# Patient Record
Sex: Female | Born: 1955 | Race: White | Hispanic: No | Marital: Married | Smoking: Former smoker
Health system: Southern US, Community
[De-identification: ages and names within clinical notes are randomized; demographics above are authoritative.]

## PROBLEM LIST (undated history)

## (undated) DIAGNOSIS — D126 Benign neoplasm of colon, unspecified: Secondary | ICD-10-CM

## (undated) DIAGNOSIS — K5909 Other constipation: Secondary | ICD-10-CM

## (undated) DIAGNOSIS — E785 Hyperlipidemia, unspecified: Secondary | ICD-10-CM

## (undated) DIAGNOSIS — Z8669 Personal history of other diseases of the nervous system and sense organs: Secondary | ICD-10-CM

## (undated) DIAGNOSIS — I7100 Dissection of unspecified site of aorta: Secondary | ICD-10-CM

## (undated) DIAGNOSIS — K589 Irritable bowel syndrome without diarrhea: Secondary | ICD-10-CM

## (undated) DIAGNOSIS — I10 Essential (primary) hypertension: Secondary | ICD-10-CM

## (undated) HISTORY — DX: Irritable bowel syndrome, unspecified: K58.9

## (undated) HISTORY — PX: TONSILLECTOMY AND ADENOIDECTOMY: SUR1326

## (undated) HISTORY — DX: Personal history of other diseases of the nervous system and sense organs: Z86.69

## (undated) HISTORY — PX: FOOT SURGERY: SHX648

## (undated) HISTORY — DX: Essential (primary) hypertension: I10

## (undated) HISTORY — PX: LUMBAR FUSION: SHX111

## (undated) HISTORY — DX: Hyperlipidemia, unspecified: E78.5

## (undated) HISTORY — DX: Dissection of unspecified site of aorta: I71.00

## (undated) HISTORY — PX: ABDOMINAL HYSTERECTOMY: SHX81

## (undated) HISTORY — DX: Benign neoplasm of colon, unspecified: D12.6

## (undated) HISTORY — DX: Other constipation: K59.09

---

## 1998-12-07 ENCOUNTER — Other Ambulatory Visit: Admission: RE | Admit: 1998-12-07 | Discharge: 1998-12-07 | Payer: Self-pay | Admitting: Obstetrics and Gynecology

## 1999-03-24 ENCOUNTER — Ambulatory Visit (HOSPITAL_COMMUNITY): Admission: RE | Admit: 1999-03-24 | Discharge: 1999-03-24 | Payer: Self-pay | Admitting: Specialist

## 1999-07-19 ENCOUNTER — Emergency Department (HOSPITAL_COMMUNITY): Admission: EM | Admit: 1999-07-19 | Discharge: 1999-07-19 | Payer: Self-pay | Admitting: Emergency Medicine

## 1999-11-02 ENCOUNTER — Other Ambulatory Visit: Admission: RE | Admit: 1999-11-02 | Discharge: 1999-11-02 | Payer: Self-pay | Admitting: Otolaryngology

## 1999-11-02 ENCOUNTER — Encounter (INDEPENDENT_AMBULATORY_CARE_PROVIDER_SITE_OTHER): Payer: Self-pay | Admitting: Specialist

## 1999-11-24 ENCOUNTER — Other Ambulatory Visit: Admission: RE | Admit: 1999-11-24 | Discharge: 1999-11-24 | Payer: Self-pay | Admitting: Obstetrics and Gynecology

## 2000-12-13 ENCOUNTER — Encounter (INDEPENDENT_AMBULATORY_CARE_PROVIDER_SITE_OTHER): Payer: Self-pay

## 2000-12-13 ENCOUNTER — Other Ambulatory Visit: Admission: RE | Admit: 2000-12-13 | Discharge: 2000-12-13 | Payer: Self-pay | Admitting: Obstetrics and Gynecology

## 2001-02-07 ENCOUNTER — Other Ambulatory Visit: Admission: RE | Admit: 2001-02-07 | Discharge: 2001-02-07 | Payer: Self-pay | Admitting: Obstetrics and Gynecology

## 2001-12-08 ENCOUNTER — Observation Stay (HOSPITAL_COMMUNITY): Admission: RE | Admit: 2001-12-08 | Discharge: 2001-12-09 | Payer: Self-pay | Admitting: Obstetrics and Gynecology

## 2001-12-08 ENCOUNTER — Encounter (INDEPENDENT_AMBULATORY_CARE_PROVIDER_SITE_OTHER): Payer: Self-pay | Admitting: Specialist

## 2002-06-04 ENCOUNTER — Ambulatory Visit (HOSPITAL_COMMUNITY): Admission: RE | Admit: 2002-06-04 | Discharge: 2002-06-04 | Payer: Self-pay | Admitting: Gastroenterology

## 2002-11-09 ENCOUNTER — Other Ambulatory Visit: Admission: RE | Admit: 2002-11-09 | Discharge: 2002-11-09 | Payer: Self-pay | Admitting: Obstetrics and Gynecology

## 2004-02-03 ENCOUNTER — Other Ambulatory Visit: Admission: RE | Admit: 2004-02-03 | Discharge: 2004-02-03 | Payer: Self-pay | Admitting: Obstetrics and Gynecology

## 2005-04-02 ENCOUNTER — Other Ambulatory Visit: Admission: RE | Admit: 2005-04-02 | Discharge: 2005-04-02 | Payer: Self-pay | Admitting: *Deleted

## 2006-03-13 ENCOUNTER — Ambulatory Visit: Payer: Self-pay | Admitting: Internal Medicine

## 2006-03-21 ENCOUNTER — Ambulatory Visit: Payer: Self-pay

## 2006-03-21 ENCOUNTER — Ambulatory Visit: Payer: Self-pay | Admitting: Internal Medicine

## 2006-04-05 ENCOUNTER — Ambulatory Visit: Payer: Self-pay | Admitting: Internal Medicine

## 2007-02-20 ENCOUNTER — Ambulatory Visit: Payer: Self-pay | Admitting: Family Medicine

## 2007-02-20 LAB — CONVERTED CEMR LAB
Alkaline Phosphatase: 60 units/L (ref 39–117)
Basophils Absolute: 0 10*3/uL (ref 0.0–0.1)
Basophils Relative: 0.6 % (ref 0.0–1.0)
Bilirubin, Direct: 0.1 mg/dL (ref 0.0–0.3)
CO2: 28 meq/L (ref 19–32)
Chloride: 107 meq/L (ref 96–112)
Creatinine, Ser: 0.5 mg/dL (ref 0.4–1.2)
Hemoglobin: 13.8 g/dL (ref 12.0–15.0)
Lipase: 29 units/L (ref 11.0–59.0)
MCHC: 34.7 g/dL (ref 30.0–36.0)
MCV: 90.3 fL (ref 78.0–100.0)
Monocytes Absolute: 0.2 10*3/uL (ref 0.2–0.7)
Monocytes Relative: 5.4 % (ref 3.0–11.0)
Platelets: 196 10*3/uL (ref 150–400)
RDW: 11.9 % (ref 11.5–14.6)
Sodium: 140 meq/L (ref 135–145)
Total Bilirubin: 1.2 mg/dL (ref 0.3–1.2)
Total Protein: 6.9 g/dL (ref 6.0–8.3)
WBC: 4.4 10*3/uL — ABNORMAL LOW (ref 4.5–10.5)

## 2007-03-10 ENCOUNTER — Ambulatory Visit: Payer: Self-pay | Admitting: Internal Medicine

## 2007-03-10 ENCOUNTER — Encounter: Payer: Self-pay | Admitting: Internal Medicine

## 2007-03-12 ENCOUNTER — Encounter: Admission: RE | Admit: 2007-03-12 | Discharge: 2007-03-12 | Payer: Self-pay | Admitting: Family Medicine

## 2007-03-26 ENCOUNTER — Encounter (INDEPENDENT_AMBULATORY_CARE_PROVIDER_SITE_OTHER): Payer: Self-pay | Admitting: Internal Medicine

## 2007-04-09 DIAGNOSIS — F329 Major depressive disorder, single episode, unspecified: Secondary | ICD-10-CM

## 2007-06-27 ENCOUNTER — Ambulatory Visit: Payer: Self-pay | Admitting: Gastroenterology

## 2007-07-23 ENCOUNTER — Ambulatory Visit: Payer: Self-pay | Admitting: Gastroenterology

## 2007-07-23 ENCOUNTER — Encounter: Payer: Self-pay | Admitting: Gastroenterology

## 2007-07-23 ENCOUNTER — Encounter: Payer: Self-pay | Admitting: Family Medicine

## 2007-07-28 ENCOUNTER — Encounter: Payer: Self-pay | Admitting: Internal Medicine

## 2007-11-05 ENCOUNTER — Telehealth (INDEPENDENT_AMBULATORY_CARE_PROVIDER_SITE_OTHER): Payer: Self-pay | Admitting: *Deleted

## 2007-11-06 ENCOUNTER — Ambulatory Visit: Payer: Self-pay | Admitting: Internal Medicine

## 2007-11-06 DIAGNOSIS — S300XXA Contusion of lower back and pelvis, initial encounter: Secondary | ICD-10-CM

## 2007-11-15 LAB — CONVERTED CEMR LAB
Basophils Relative: 0.4 % (ref 0.0–1.0)
Eosinophils Absolute: 0.3 10*3/uL (ref 0.0–0.6)
Eosinophils Relative: 4.8 % (ref 0.0–5.0)
Hemoglobin: 13.7 g/dL (ref 12.0–15.0)
MCHC: 34.6 g/dL (ref 30.0–36.0)
Monocytes Absolute: 0.2 10*3/uL (ref 0.2–0.7)
Monocytes Relative: 3.1 % (ref 3.0–11.0)
Neutro Abs: 4.2 10*3/uL (ref 1.4–7.7)
RBC: 4.34 M/uL (ref 3.87–5.11)

## 2007-11-17 ENCOUNTER — Encounter (INDEPENDENT_AMBULATORY_CARE_PROVIDER_SITE_OTHER): Payer: Self-pay | Admitting: *Deleted

## 2007-12-05 IMAGING — US US PELVIS COMPLETE
1 series · 13 of 25 positions shown · non-contrast
Comparison: none

CLINICAL DATA: COMPLETE ABDOMINAL ULTRASOUND:
TECHNIQUE: Complete abdominal ultrasound examination was performed including evaluation of the liver, gallbladder, bile ducts, pancreas, kidneys, spleen, IVC, and abdominal aorta.
TECHNIQUE: Both transabdominal and transvaginal ultrasound examinations of the pelvis were performed including evaluation of the uterus, ovaries, adnexal regions, and pelvic cul-de-sac.

[Series 1: us pelvis complete · 0.27mm/px · 13 of 32 slices shown]
[im 1/32]
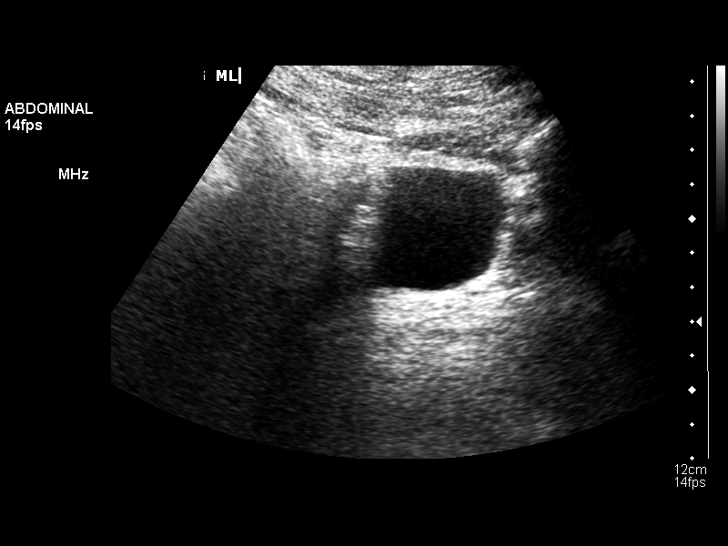
[im 3/32]
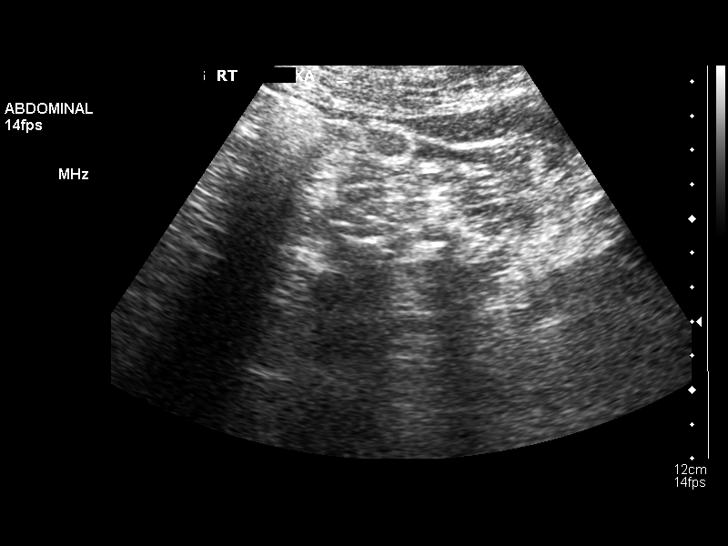
[im 6/32]
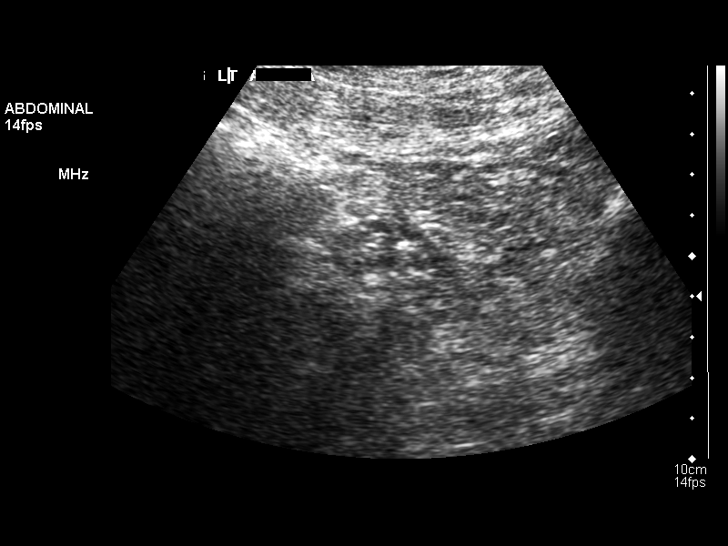
[im 8/32]
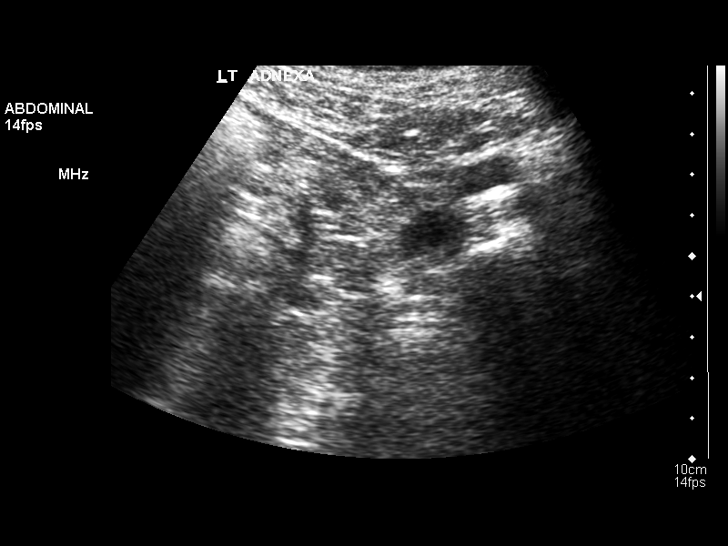
[im 11/32]
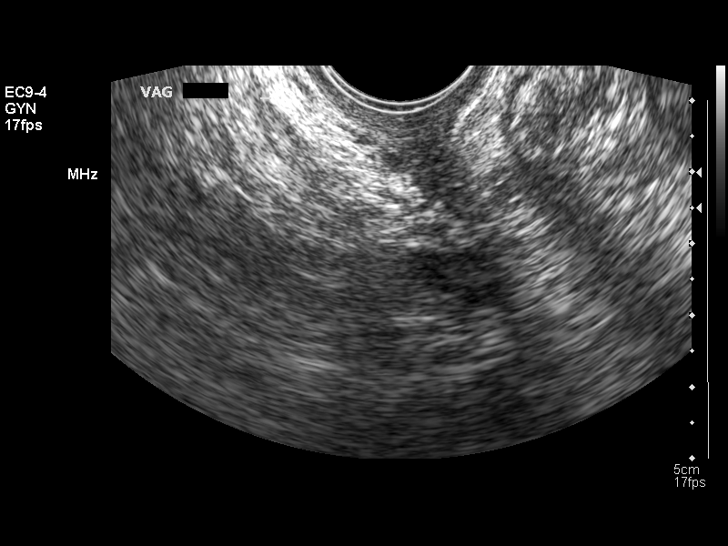
[im 13/32]
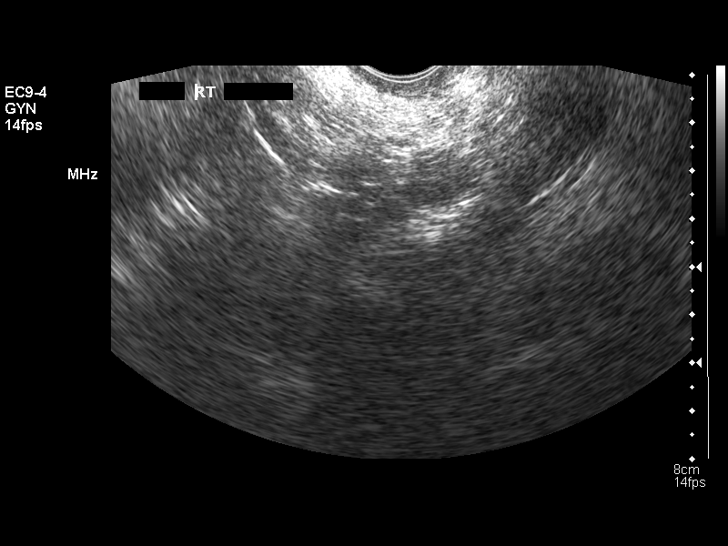
[im 16/32]
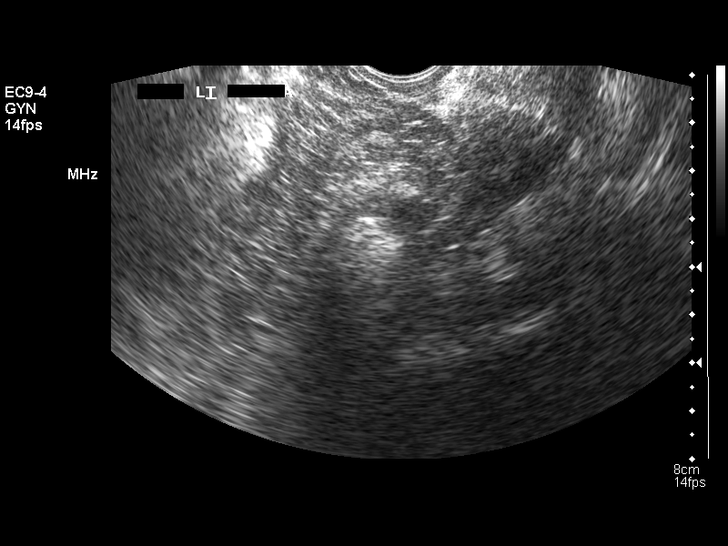
[im 19/32]
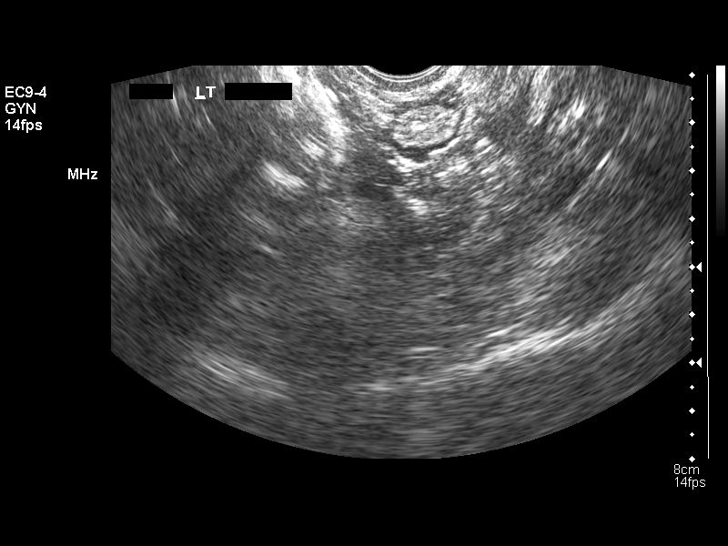
[im 21/32]
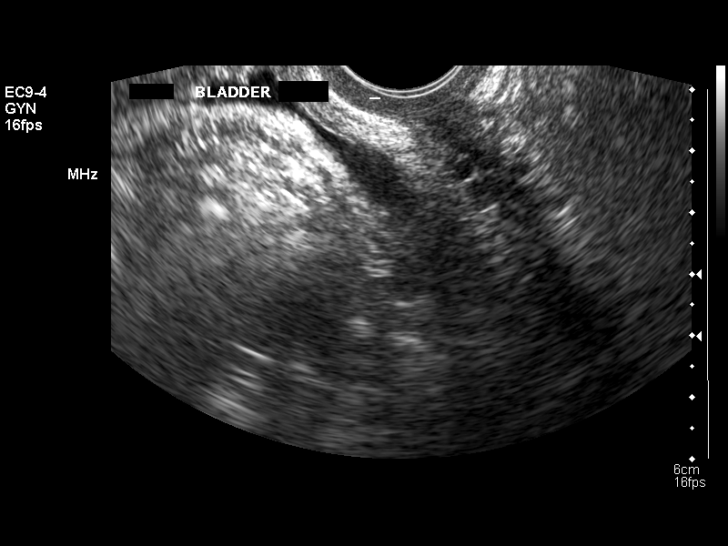
[im 24/32]
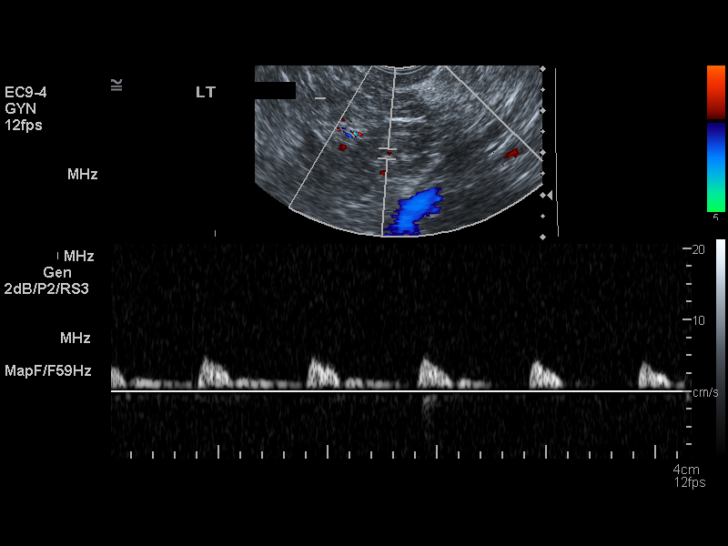
[im 26/32]
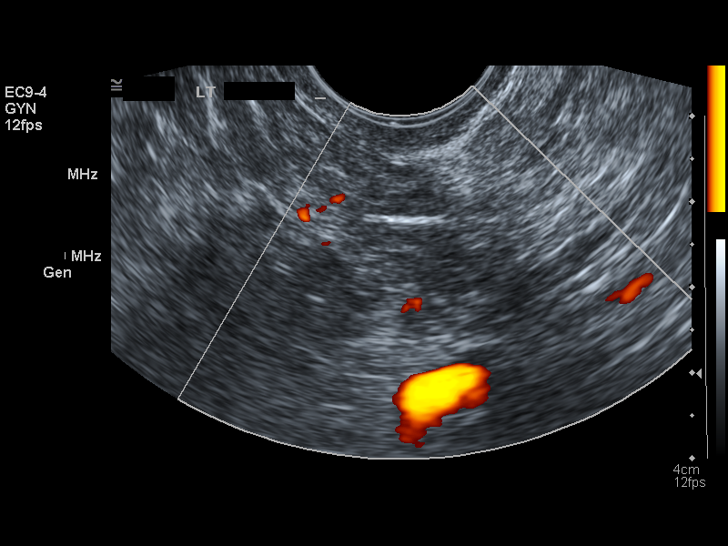
[im 29/32]
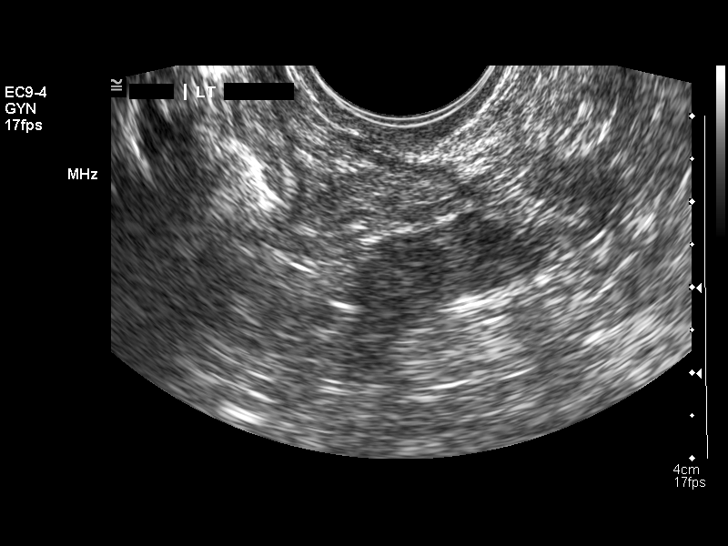
[im 32/32]
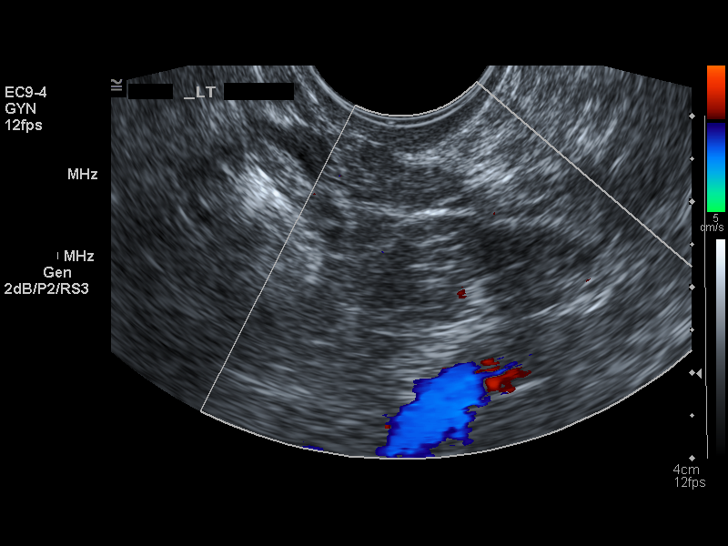

[13 of 25 positions shown; findings below may reference images not displayed]

FINDINGS: Scans over the upper abdomen were performed.  There is a single gallbladder polyp of 6mm.  No gallstones are seen.  The liver has a normal echogenic pattern.  The common bile duct is within upper limits of normal measuring 6.6mm.  The IVC, pancreas, and spleen are well seen with the spleen being within upper limits of normal.  No hydronephrosis is seen other than minimal fullness to the upper pole collecting system on the right which appears to be caused by compression of the collecting system by a large cyst in the upper pole of 8.3 x 6.4 x 7.8cm.  The right kidney measures 11.9cm sagittally with the left kidney measuring 13.0cm.  The abdominal aorta is fusiformly prominent with no focal aneurysmal dilatation.
IMPRESSION: 1.  Single gallbladder polyp.  No gallstones.  Common bile duct upper normal.  
2.  Liver and spleen are within upper limits of normal. 
3.  Large cyst in upper pole of right kidney. 
COMPLETE PELVIC/TRANSVAGINAL ULTRASOUND:
FINDINGS: By history the patient has undergone hysterectomy and single oophorectomy not knowing which ovary was removed.  The uterus and right ovary are not seen.  There is questionable left ovary of 2.0 x 0.9 x 1.3cm.  No free fluid is seen.
IMPRESSION: No pelvic mass or adenopathy.  Question small left ovary remaining with prior history of hysterectomy and probable right oophorectomy.

## 2008-01-26 DIAGNOSIS — K449 Diaphragmatic hernia without obstruction or gangrene: Secondary | ICD-10-CM | POA: Insufficient documentation

## 2008-01-26 DIAGNOSIS — K649 Unspecified hemorrhoids: Secondary | ICD-10-CM

## 2008-01-26 DIAGNOSIS — D126 Benign neoplasm of colon, unspecified: Secondary | ICD-10-CM

## 2008-01-26 DIAGNOSIS — Z8719 Personal history of other diseases of the digestive system: Secondary | ICD-10-CM

## 2008-07-04 ENCOUNTER — Inpatient Hospital Stay (HOSPITAL_COMMUNITY): Admission: AD | Admit: 2008-07-04 | Discharge: 2008-07-13 | Payer: Self-pay | Admitting: Internal Medicine

## 2008-07-04 ENCOUNTER — Ambulatory Visit: Payer: Self-pay | Admitting: Thoracic Surgery (Cardiothoracic Vascular Surgery)

## 2008-07-04 ENCOUNTER — Encounter: Payer: Self-pay | Admitting: Internal Medicine

## 2008-07-04 ENCOUNTER — Encounter: Payer: Self-pay | Admitting: Emergency Medicine

## 2008-07-04 ENCOUNTER — Ambulatory Visit: Payer: Self-pay | Admitting: Internal Medicine

## 2008-07-04 ENCOUNTER — Ambulatory Visit: Payer: Self-pay | Admitting: Cardiology

## 2008-07-04 DIAGNOSIS — R9431 Abnormal electrocardiogram [ECG] [EKG]: Secondary | ICD-10-CM | POA: Insufficient documentation

## 2008-07-04 DIAGNOSIS — K219 Gastro-esophageal reflux disease without esophagitis: Secondary | ICD-10-CM | POA: Insufficient documentation

## 2008-07-08 ENCOUNTER — Encounter: Payer: Self-pay | Admitting: Internal Medicine

## 2008-07-22 ENCOUNTER — Ambulatory Visit: Payer: Self-pay | Admitting: Internal Medicine

## 2008-07-22 DIAGNOSIS — F411 Generalized anxiety disorder: Secondary | ICD-10-CM | POA: Insufficient documentation

## 2008-07-22 DIAGNOSIS — I7101 Dissection of thoracic aorta: Secondary | ICD-10-CM

## 2008-08-10 ENCOUNTER — Encounter: Payer: Self-pay | Admitting: Internal Medicine

## 2008-08-12 ENCOUNTER — Ambulatory Visit: Payer: Self-pay | Admitting: Internal Medicine

## 2008-08-12 DIAGNOSIS — G4700 Insomnia, unspecified: Secondary | ICD-10-CM

## 2008-08-19 ENCOUNTER — Telehealth (INDEPENDENT_AMBULATORY_CARE_PROVIDER_SITE_OTHER): Payer: Self-pay | Admitting: *Deleted

## 2008-08-27 ENCOUNTER — Encounter: Payer: Self-pay | Admitting: Internal Medicine

## 2008-08-31 ENCOUNTER — Telehealth: Payer: Self-pay | Admitting: Internal Medicine

## 2008-08-31 ENCOUNTER — Encounter: Payer: Self-pay | Admitting: Internal Medicine

## 2008-09-07 ENCOUNTER — Telehealth (INDEPENDENT_AMBULATORY_CARE_PROVIDER_SITE_OTHER): Payer: Self-pay | Admitting: *Deleted

## 2008-09-14 ENCOUNTER — Telehealth (INDEPENDENT_AMBULATORY_CARE_PROVIDER_SITE_OTHER): Payer: Self-pay | Admitting: *Deleted

## 2008-09-14 ENCOUNTER — Ambulatory Visit: Payer: Self-pay | Admitting: Internal Medicine

## 2008-09-14 DIAGNOSIS — I951 Orthostatic hypotension: Secondary | ICD-10-CM | POA: Insufficient documentation

## 2008-09-14 DIAGNOSIS — R7309 Other abnormal glucose: Secondary | ICD-10-CM

## 2008-09-20 ENCOUNTER — Encounter (INDEPENDENT_AMBULATORY_CARE_PROVIDER_SITE_OTHER): Payer: Self-pay | Admitting: *Deleted

## 2008-09-20 LAB — CONVERTED CEMR LAB: Hgb A1c MFr Bld: 5.5 % (ref 4.6–6.0)

## 2008-10-11 ENCOUNTER — Telehealth (INDEPENDENT_AMBULATORY_CARE_PROVIDER_SITE_OTHER): Payer: Self-pay | Admitting: *Deleted

## 2008-10-12 ENCOUNTER — Ambulatory Visit: Payer: Self-pay | Admitting: Family Medicine

## 2008-10-12 ENCOUNTER — Encounter (INDEPENDENT_AMBULATORY_CARE_PROVIDER_SITE_OTHER): Payer: Self-pay | Admitting: *Deleted

## 2008-11-02 ENCOUNTER — Telehealth (INDEPENDENT_AMBULATORY_CARE_PROVIDER_SITE_OTHER): Payer: Self-pay | Admitting: *Deleted

## 2008-11-08 ENCOUNTER — Telehealth (INDEPENDENT_AMBULATORY_CARE_PROVIDER_SITE_OTHER): Payer: Self-pay | Admitting: *Deleted

## 2008-11-09 ENCOUNTER — Encounter: Payer: Self-pay | Admitting: Internal Medicine

## 2009-02-07 ENCOUNTER — Telehealth (INDEPENDENT_AMBULATORY_CARE_PROVIDER_SITE_OTHER): Payer: Self-pay | Admitting: *Deleted

## 2009-03-17 ENCOUNTER — Ambulatory Visit: Payer: Self-pay | Admitting: Family Medicine

## 2009-05-23 ENCOUNTER — Ambulatory Visit: Payer: Self-pay | Admitting: Internal Medicine

## 2009-05-23 DIAGNOSIS — M2550 Pain in unspecified joint: Secondary | ICD-10-CM | POA: Insufficient documentation

## 2009-05-23 DIAGNOSIS — IMO0001 Reserved for inherently not codable concepts without codable children: Secondary | ICD-10-CM

## 2009-05-25 LAB — CONVERTED CEMR LAB
Calcium: 9.2 mg/dL (ref 8.4–10.5)
Iron: 71 ug/dL (ref 42–145)
Potassium: 3.1 meq/L — ABNORMAL LOW (ref 3.5–5.1)
Rhuematoid fact SerPl-aCnc: 20 intl units/mL (ref 0.0–20.0)

## 2009-05-26 ENCOUNTER — Encounter (INDEPENDENT_AMBULATORY_CARE_PROVIDER_SITE_OTHER): Payer: Self-pay | Admitting: *Deleted

## 2009-05-26 ENCOUNTER — Telehealth (INDEPENDENT_AMBULATORY_CARE_PROVIDER_SITE_OTHER): Payer: Self-pay | Admitting: *Deleted

## 2009-06-07 ENCOUNTER — Encounter: Payer: Self-pay | Admitting: Internal Medicine

## 2009-06-21 ENCOUNTER — Telehealth (INDEPENDENT_AMBULATORY_CARE_PROVIDER_SITE_OTHER): Payer: Self-pay | Admitting: *Deleted

## 2009-07-22 ENCOUNTER — Telehealth (INDEPENDENT_AMBULATORY_CARE_PROVIDER_SITE_OTHER): Payer: Self-pay | Admitting: *Deleted

## 2009-08-04 ENCOUNTER — Ambulatory Visit: Payer: Self-pay | Admitting: Internal Medicine

## 2009-08-05 ENCOUNTER — Telehealth (INDEPENDENT_AMBULATORY_CARE_PROVIDER_SITE_OTHER): Payer: Self-pay | Admitting: *Deleted

## 2009-08-05 DIAGNOSIS — S97109A Crushing injury of unspecified toe(s), initial encounter: Secondary | ICD-10-CM | POA: Insufficient documentation

## 2009-10-18 ENCOUNTER — Telehealth (INDEPENDENT_AMBULATORY_CARE_PROVIDER_SITE_OTHER): Payer: Self-pay | Admitting: *Deleted

## 2009-12-13 ENCOUNTER — Telehealth (INDEPENDENT_AMBULATORY_CARE_PROVIDER_SITE_OTHER): Payer: Self-pay | Admitting: *Deleted

## 2009-12-14 ENCOUNTER — Encounter (INDEPENDENT_AMBULATORY_CARE_PROVIDER_SITE_OTHER): Payer: Self-pay | Admitting: *Deleted

## 2009-12-14 ENCOUNTER — Ambulatory Visit: Payer: Self-pay | Admitting: Internal Medicine

## 2009-12-14 DIAGNOSIS — K5900 Constipation, unspecified: Secondary | ICD-10-CM

## 2009-12-14 DIAGNOSIS — R1033 Periumbilical pain: Secondary | ICD-10-CM | POA: Insufficient documentation

## 2009-12-14 DIAGNOSIS — I959 Hypotension, unspecified: Secondary | ICD-10-CM

## 2009-12-21 ENCOUNTER — Telehealth (INDEPENDENT_AMBULATORY_CARE_PROVIDER_SITE_OTHER): Payer: Self-pay | Admitting: *Deleted

## 2009-12-21 ENCOUNTER — Encounter: Admission: RE | Admit: 2009-12-21 | Discharge: 2009-12-21 | Payer: Self-pay | Admitting: Internal Medicine

## 2009-12-21 ENCOUNTER — Ambulatory Visit: Payer: Self-pay | Admitting: Internal Medicine

## 2009-12-22 ENCOUNTER — Encounter: Payer: Self-pay | Admitting: Internal Medicine

## 2009-12-23 LAB — CONVERTED CEMR LAB
Amylase: 40 units/L (ref 27–131)
Basophils Relative: 0.6 % (ref 0.0–3.0)
Eosinophils Absolute: 0.1 10*3/uL (ref 0.0–0.7)
Eosinophils Relative: 2.2 % (ref 0.0–5.0)
Lymphocytes Relative: 28 % (ref 12.0–46.0)
MCV: 93.1 fL (ref 78.0–100.0)
Monocytes Absolute: 0.3 10*3/uL (ref 0.1–1.0)
Monocytes Relative: 6.2 % (ref 3.0–12.0)
Neutro Abs: 2.7 10*3/uL (ref 1.4–7.7)
RBC: 4.11 M/uL (ref 3.87–5.11)

## 2010-01-02 ENCOUNTER — Telehealth: Payer: Self-pay | Admitting: Internal Medicine

## 2010-02-20 ENCOUNTER — Telehealth (INDEPENDENT_AMBULATORY_CARE_PROVIDER_SITE_OTHER): Payer: Self-pay | Admitting: *Deleted

## 2010-03-14 ENCOUNTER — Telehealth (INDEPENDENT_AMBULATORY_CARE_PROVIDER_SITE_OTHER): Payer: Self-pay | Admitting: *Deleted

## 2010-03-24 ENCOUNTER — Ambulatory Visit: Payer: Self-pay | Admitting: Internal Medicine

## 2010-03-24 DIAGNOSIS — R519 Headache, unspecified: Secondary | ICD-10-CM | POA: Insufficient documentation

## 2010-03-24 DIAGNOSIS — R51 Headache: Secondary | ICD-10-CM

## 2010-03-24 DIAGNOSIS — G459 Transient cerebral ischemic attack, unspecified: Secondary | ICD-10-CM | POA: Insufficient documentation

## 2010-03-24 DIAGNOSIS — I10 Essential (primary) hypertension: Secondary | ICD-10-CM | POA: Insufficient documentation

## 2010-03-28 ENCOUNTER — Telehealth (INDEPENDENT_AMBULATORY_CARE_PROVIDER_SITE_OTHER): Payer: Self-pay | Admitting: *Deleted

## 2010-03-29 ENCOUNTER — Telehealth: Payer: Self-pay | Admitting: Internal Medicine

## 2010-04-18 ENCOUNTER — Encounter: Payer: Self-pay | Admitting: Internal Medicine

## 2010-04-19 ENCOUNTER — Encounter: Payer: Self-pay | Admitting: Internal Medicine

## 2010-05-02 ENCOUNTER — Telehealth (INDEPENDENT_AMBULATORY_CARE_PROVIDER_SITE_OTHER): Payer: Self-pay | Admitting: *Deleted

## 2010-05-26 ENCOUNTER — Ambulatory Visit: Payer: Self-pay | Admitting: Internal Medicine

## 2010-09-01 ENCOUNTER — Encounter: Payer: Self-pay | Admitting: Internal Medicine

## 2010-11-19 ENCOUNTER — Encounter: Payer: Self-pay | Admitting: Thoracic Surgery (Cardiothoracic Vascular Surgery)

## 2010-11-30 NOTE — Progress Notes (Signed)
Summary: U/S Results  Phone Note Outgoing Call Call back at [facility] Phone [phone]   Call placed by: Shonna Chock,  December 21, 2009 3:32 PM Call placed to: Patient Summary of Call: Left message on machine for patient to return call when avaliable, Reason for call:   [place] of pelvis reveal no significant pathology. Please FAX these reports to her Gyn also. Hopp Initial call taken by: Shonna Chock,  December 21, 2009 3:32 PM  Follow-up for Phone Call        left message to call office...........Marland KitchenFelecia Deloach CMA  December 23, 2009 9:54 AM   Additional Follow-up for Phone Call Additional follow up Details #1::        pt GYN is Dr Harold Hedge reports faxed..........Marland KitchenFelecia Deloach CMA  December 23, 2009 3:29 PM

## 2010-11-30 NOTE — Assessment & Plan Note (Signed)
Summary: severe stomach pains/kdc   Vital Signs:  Patient profile:   55 year old female Weight:      158.2 pounds Temp:     98.1 degrees F oral Pulse rate:   64 / minute Resp:     14 per minute BP sitting:   100 / 60  (left arm) Cuff size:   large  Vitals Entered By: Shonna Chock (December 14, 2009 3:56 PM) CC: Stomach discomfort, Abdominal pain Comments REVIEWED MED LIST, PATIENT AGREED DOSE AND INSTRUCTION CORRECT    Primary Care Provider:  Alwyn Ren  CC:  Stomach discomfort and Abdominal pain.  History of Present Illness:  Abdominal Pain      This is a 55 year old woman who presents with Abdominal pain for 2 weeks .  The patient reports constipation and  slight anorexia, but denies nausea, vomiting, diarrhea, melena, hematochezia, and hematemesis.  The location of the pain is right mid  quadrant.  The pain is described as intermittent, sharp, and radiating to the back.No triggers or relievers for abd pain.  The patient denies the following symptoms: fever, weight loss, dysuria, chest pain, jaundice, dark urine, and vaginal bleeding. Rx: Miralx , PeriColace , Fleet's suppository w/o benefit. PMH of dissecting  thoracoabdominal aorta, monitored @ [facility], Dr Everardo Beals .No PMH of appendectomy; PMH of TAH & USO by Dr Henderson Cloud ( ? R oophorectomy ) for fibroids , dysfunctional menses & ? ovarian disease. BP recently low , down to 70/40  Allergies (verified): No Known Drug Allergies  Review of Systems General:  Denies chills and sweats. ENT:  Denies difficulty swallowing and hoarseness. GI:  Denies gas. GU:  Denies discharge and hematuria. Derm:  Denies lesion(s) and rash. Heme:  Denies abnormal bruising and bleeding.  Physical Exam  General:  well-nourished,in no acute distress; alert,appropriate and cooperative throughout examination; mildly uncomfortable-appearing.   Eyes:  No corneal or conjunctival inflammation noted.Perrla. Funduscopic exam benign, without hemorrhages, exudates or  papilledema. No icterus Mouth:  Oral mucosa and oropharynx without lesions or exudates.  Teeth in good repair. No pharyngeal erythema.   Lungs:  Normal respiratory effort, chest expands symmetrically. Lungs are clear to auscultation, no crackles or wheezes. Heart:  regular rhythm, no murmur, and bradycardia.   Abdomen:  Bowel sounds positive,abdomen soft  but tender  R mid without masses, organomegaly or hernias noted. Rectal:  Given stool cards Skin:  Intact without suspicious lesions or rashes. No jaundice Cervical Nodes:  No lymphadenopathy noted Axillary Nodes:  No palpable lymphadenopathy Psych:  Oriented X3 ; subdued.     Impression & Recommendations:  Problem # 1:  ABDOMINAL PAIN, PERIUMBILICAL (ICD-789.05)  Orders: Radiology Referral (Radiology)  Problem # 2:  CONSTIPATION (ICD-564.00)  Problem # 3:  HYPOTENSION (ICD-458.9) intermittent  Complete Medication List: 1)  Effexor Xr 75 Mg Xr24h-cap (Venlafaxine hcl) .Marland Kitchen.. 1 by mouth once daily 2)  Catapres 0.1 Mg Tabs (Clonidine hcl) .... 1/2  by mouth  two times a day 3)  Labetalol Hcl 200 Mg Tabs (Labetalol hcl) .... 2 by mouth three times a day 4)  Vitamin D (ergocalciferol) 50000 Unit Caps (Ergocalciferol) .Marland Kitchen.. 1 by mouth 1 x weekly**take the same day every week** 5)  Spironolactone 25 Mg Tabs (Spironolactone) .Marland Kitchen.. 1 by mouth once daily 6)  Hyoscyamine Sulfate 0.125 Mg Subl (Hyoscyamine sulfate) .Marland Kitchen.. 1 sl q 6 hrs as needed  Patient Instructions: 1)  Fasting labs: amylase, lipase, CBC & dif, TSH, vit D level(Codes: 789.05, 268.9,564.00). Complete stool cards.  Prescriptions: HYOSCYAMINE SULFATE 0.125 MG SUBL (HYOSCYAMINE SULFATE) 1 sl q 6 hrs as needed  #30 x 0   Entered and Authorized by:   Marga Melnick MD   Signed by:   Marga Melnick MD on 12/14/2009   Method used:   [facility] ...       [facility]  [address] [phone]* (retail)       [address] [place], [place]  [address]       Ph: [phone] or [phone]        Fax: [phone]   RxID:   [id number]

## 2010-11-30 NOTE — Letter (Signed)
Summary: [facility]  [facility]   Imported By: Lanelle Bal 05/10/2010 10:10:34  _____________________________________________________________________  External Attachment:    Type:   Image     Comment:   External Document

## 2010-11-30 NOTE — Progress Notes (Signed)
Summary: Refill Request  Phone Note Refill Request Call back at [phone] Message from:  Pharmacy on Mar 28, 2010 9:33 AM  Refills Requested: Medication #1:  LABETALOL HCL 200 MG TABS 2 by mouth three times a day   Dosage confirmed as above?Dosage Confirmed   [facility] [address]  Next Appointment Scheduled: 6.1.11 (lab) Initial call taken by: Harold Barban,  Mar 28, 2010 9:33 AM  Follow-up for Phone Call        Called the pharmacy and indicated :02/20/10 # 180 with 2 refills  Patient should not be due for additional refills at this time Follow-up by: Shonna Chock,  Mar 28, 2010 10:20 AM

## 2010-11-30 NOTE — Progress Notes (Signed)
Summary: Refill Request  Phone Note Refill Request Call back at [phone] Message from:  Pharmacy on Mar 14, 2010 8:10 AM  Refills Requested: Medication #1:  HYDROCHLOROTHIAZIDE 12.5 MG CAPS take 1 cap once daily   Dosage confirmed as above?Dosage Confirmed   Supply Requested: 3 months   [facility] [address] [facility] [place]  Next Appointment Scheduled: none Initial call taken by: Harold Barban,  Mar 14, 2010 8:10 AM    Prescriptions: HYDROCHLOROTHIAZIDE 12.5 MG CAPS (HYDROCHLOROTHIAZIDE) take 1 cap once daily  #90 x 1   Entered by:   Shonna Chock   Authorized by:   Marga Melnick MD   Signed by:   Shonna Chock on 03/14/2010   Method used:   [facility]        [facility] [phone][facility] (retail)       [address] [place], [place]  [address]       Ph: [phone] or [phone]       Fax: [phone]   RxID:   [id number]

## 2010-11-30 NOTE — Progress Notes (Signed)
Summary: SEVERE STOMACH PAINS  Phone Note Call from Patient Call back at Work Phone [phone] Call back at [phone]   Caller: Patient Summary of Call: PATIENT CALLED TO GET APPOINTMENT WITH DR HOPPER DUE TO SEVERE STOMACH PAINS---SAYS SHE HAD AORTIC DISSECTION LAST YEAR AND IS WORRIED IT MIGHT BE SAME THING---PAIN HAS BEEN GOING ON FOR A FEW DAYS--SHE HASNT TAKEN ANYTHING FOR IT--VERY WORRIED--WANTS TO BE SEEN TODAY  PLEASE CALL [phone] Initial call taken by: Jerolyn Shin,  December 13, 2009 12:35 PM  Follow-up for Phone Call        Spoke with patient and informed  her Dr.Hopper is running behind and we could double-book her but she will have to wait (1 hour or less) Patient said she doesnt feel like waiting. Patient said she would rather come in tomorrow, late if possible. 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 Follow-up by: Shonna Chock,  December 13, 2009 1:20 PM    Additional Follow-up for Phone Call Additional follow up Details #2::    pt scheduled for Feb 16,2011 Follow-up by: Barb Merino,  December 13, 2009 2:38 PM

## 2010-11-30 NOTE — Letter (Signed)
Summary: [facility]  [facility]   Imported By: Lanelle Bal 09/19/2010 08:22:13  _____________________________________________________________________  External Attachment:    Type:   Image     Comment:   External Document

## 2010-11-30 NOTE — Assessment & Plan Note (Signed)
Summary: HOSPTIAL FOLLOWUP/KN   Vital Signs:  Patient profile:   55 year old female Weight:      151.8 pounds BMI:     25.35 Pulse rate:   64 / minute Resp:     14 per minute BP sitting:   94 / 60  (left arm) Cuff size:   large  Vitals Entered By: Shonna Chock CMA (May 26, 2010 3:54 PM) CC: Hospital Follow-up   Primary Care Provider:  Alwyn Ren  CC:  Hospital Follow-up.  History of Present Illness: Dr Norris Cross D/C Summary 04/19/2010 was reviewed; she has been taking  Labetatlol as Rxed (200 mg three times a day ) &  Clonidine 0.1 mg is once daily . BP parameters were no HYPOtension & no BP > 140/90. These have been met.Her records  indicate range 90+/ 60- 130/70. She has had increased energy since med adjustment.   The patient reports headaches, but these are less severe &  less frequent on Topamax. She  denies lightheadedness, urinary frequency, rash, and fatigue.  The patient denies the following associated symptoms: chest pain, chest pressure, exercise intolerance, dyspnea, palpitations, syncope, leg edema, and pedal edema.  Compliance with medications (by patient report) has been near 100%.  The patient reports that dietary compliance has been good(heart healthy diet).  The patient reports exercising daily as walking  45 min & she also climbs stairs @ work.  Adjunctive measures currently used by the patient include salt restriction, but Dr Tasia Catchings has recommended slight liberalization of sodium intake @ home.    Current Medications (verified): 1)  Effexor Xr 150 Mg Xr24h-Cap (Venlafaxine Hcl) .Marland Kitchen.. 1 By Mouth Once Daily 2)  Catapres 0.1 Mg Tabs (Clonidine Hcl) .... 1/2 Two Times A Day 3)  Labetalol Hcl 200 Mg Tabs (Labetalol Hcl) .Marland Kitchen.. 1 By Mouth Two Times A Day 4)  Hydrochlorothiazide 12.5 Mg Caps (Hydrochlorothiazide) .... Take 1 Cap Once Daily 5)  Acetaminophen-Codeine #3 300-30 Mg Tabs (Acetaminophen-Codeine) .Marland Kitchen.. 1 Every 6 Hrs As Needed Pain 6)  Topamax 25 Mg Tabs (Topiramate) .Marland Kitchen..  1 By Mouth Two Times A Day 7)  Ambien 10 Mg Tabs (Zolpidem Tartrate) .Marland Kitchen.. 1 By Mouth As Needed 8)  Clonazepam 1 Mg Tabs (Clonazepam) .Marland Kitchen.. 1 By Mouth Two Times A Day As Needed  Allergies (verified): No Known Drug Allergies  Physical Exam  General:  Thin but well-nourished, alert,appropriate and cooperative throughout examination Eyes:  No corneal or conjunctival inflammation noted.  Perrla. Funduscopic exam benign, without hemorrhages, exudates or papilledema. Lungs:  Normal respiratory effort, chest expands symmetrically. Lungs are clear to auscultation, no crackles or wheezes. Heart:  regular rhythm, no murmur, no gallop, no rub, no JVD, no HJR, and bradycardia.  Soft S4 Abdomen:  Bowel sounds positive,abdomen soft and non-tender without masses, organomegaly or hernias noted. No AAA or bruits Pulses:  R and L carotid,radial,dorsalis pedis and posterior tibial pulses are full and equal bilaterally Extremities:  No clubbing, cyanosis, edema. Neurologic:  alert & oriented X3.   Skin:  Intact without suspicious lesions or rashes Psych:  memory intact for recent and remote, normally interactive, and good eye contact.     Impression & Recommendations:  Problem # 1:  HYPERTENSION (ICD-401.9) well controlled Her updated medication list for this problem includes:    Catapres 0.1 Mg Tabs (Clonidine hcl) .Marland Kitchen... 1/2 two times a day    Labetalol Hcl 200 Mg Tabs (Labetalol hcl) .Marland Kitchen... 1 by mouth three times a day    Hydrochlorothiazide 12.5 Mg  Caps (Hydrochlorothiazide) .Marland Kitchen... Take 1 cap once daily  Problem # 2:  HEADACHE (ICD-784.0) improved on Topamax 25 mg two times a day  Her updated medication list for this problem includes:    Labetalol Hcl 200 Mg Tabs (Labetalol hcl) .Marland Kitchen... 1 by mouth three times a day    Acetaminophen-codeine #3 300-30 Mg Tabs (Acetaminophen-codeine) .Marland Kitchen... 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 1 by mouth once daily 2)  Catapres 0.1 Mg Tabs (Clonidine hcl) .... 1/2 two times a day 3)  Labetalol Hcl 200 Mg Tabs (Labetalol hcl) .Marland Kitchen.. 1 by mouth three times a day 4)  Hydrochlorothiazide 12.5 Mg Caps (Hydrochlorothiazide) .... Take 1 cap once daily 5)  Acetaminophen-codeine #3 300-30 Mg Tabs (Acetaminophen-codeine) .Marland Kitchen.. 1 every 6 hrs as needed pain 6)  Topamax 25 Mg Tabs (Topiramate) .Marland Kitchen.. 1 by mouth two times a day 7)  Ambien 10 Mg Tabs (Zolpidem tartrate) .Marland Kitchen.. 1 by mouth as needed 8)  Clonazepam 1 Mg Tabs (Clonazepam) .Marland Kitchen.. 1 by mouth two times a day as needed  Patient Instructions: 1)  Check your Blood Pressure regularly. If it is above: 135/85 ON AVERAGE  you should make an appointment. Keep Headache Diary  as discussed. Prescriptions: TOPAMAX 25 MG TABS (TOPIRAMATE) 1 by mouth two times a day  #60 x 11   Entered and Authorized by:   Marga Melnick MD   Signed by:   Marga Melnick MD on 05/26/2010   Method used:   [facility] ...       [facility]  [address] [phone]* (retail)       [address] [place], [place]  [address]       Ph: [phone] or [phone]       Fax: [phone]   RxID:   [id number] HYDROCHLOROTHIAZIDE 12.5 MG CAPS (HYDROCHLOROTHIAZIDE) take 1 cap once daily  #90 x 1   Entered and Authorized by:   Marga Melnick MD   Signed by:   Marga Melnick MD on 05/26/2010   Method used:   [facility]ed to ...       [facility]  [address] [phone]* (retail)       [address] [place], [place]  [address]       Ph: [phone] or [phone]       Fax: [phone]   RxID:   [id number] LABETALOL HCL 200 MG TABS (LABETALOL HCL) 1 by mouth three times a day  #270 x 1   Entered and Authorized  by:   Marga Melnick MD   Signed by:   Marga Melnick MD on 05/26/2010   Method used:   [facility]ed to ...       [facility]  [address] [phone]* (retail)       [address] [place], [place]  [address]       Ph: [phone] or [phone]       Fax: [phone]   RxID:   [id number] CATAPRES 0.1 MG TABS (CLONIDINE HCL) 1/2 two times a day  #90 x 3   Entered and  Authorized by:   Marga Melnick MD   Signed by:   Marga Melnick MD on 05/26/2010   Method used:   [facility]ed to ...       [facility]  [address] [phone]* (retail)       [address] [place], [place]  [address]       Ph: [phone] or [phone]       Fax: [phone]   RxID:   [id number]

## 2010-11-30 NOTE — Assessment & Plan Note (Signed)
Summary: severe headaches//lch   Vital Signs:  Patient profile:   55 year old female Weight:      158 pounds Temp:     98.3 degrees F oral Pulse rate:   72 / minute Resp:     16 per minute BP sitting:   116 / 70  (left arm) Cuff size:   large  Vitals Entered By: Shonna Chock (Mar 24, 2010 2:10 PM) CC: 1.) Severe Headaches and elevated B/P (at work today 140/something-patient states)  2.) Upper back pain, Hypertension Management Comments REVIEWED MED LIST, PATIENT AGREED DOSE AND INSTRUCTION CORRECT  ** FYI-Pending CT: July 2011**   Primary Care Provider:  Alwyn Ren  CC:  1.) Severe Headaches and elevated B/P (at work today 140/something-patient states)  2.) Upper back pain and Hypertension Management.  History of Present Illness: BP @ work in 140s/90s; this is associated with upper back pain similar to that pain with the thoracic aneurysm. She increased Clonidine to 1 two times a day from 1/2 two times a day with BP  response.   The patient reports photophobia and phonophobia, but denies nausea, vomiting, sweats, tearing of eyes, nasal congestion, sinus pain, and sinus pressure.  The headache is described as intermittent and stabbing.  The location of the pain is unilateral in  the left temple & frontal area.  High-risk features (red flags) include pain worse with exertion and age >50 years.  The patient denies the following high-risk features: fever, neck pain/stiffness, altered mental status, rash, and new type of headache.  Prior treatment has included  ? Tramadol with benefit.  Hypertension History:      She denies chest pain, palpitations, dyspnea with exertion, orthopnea, PND, peripheral edema, visual symptoms, neurologic problems, syncope, and side effects from treatment.        Positive major cardiovascular risk factors include female age 57 years old or older and hypertension.  Negative major cardiovascular risk factors include non-tobacco-user status.        Positive history for  target organ damage include prior stroke (or TIA) and peripheral vascular disease.     Allergies (verified): No Known Drug Allergies  Review of Systems Eyes:  Denies blurring, double vision, and vision loss-both eyes. MS:  Complains of joint pain and joint swelling; denies joint redness; Pain in L hand intermittently. Neuro:  Complains of inability to speak; denies brief paralysis, numbness, tingling, and weakness; Slurred speech for 3-4 minutes 3 weeks ago.  Physical Exam  General:  in no acute distress; alert,appropriate and cooperative throughout examination Eyes:  No corneal or conjunctival inflammation noted. EOMI. Perrla. Field of Vision grossly normal. Ears:  External ear exam shows no significant lesions or deformities.  Otoscopic examination reveals clear canals, tympanic membranes are intact bilaterally without bulging, retraction, inflammation or discharge. Hearing is grossly normal bilaterally. Nose:  External nasal examination shows no deformity or inflammation. Nasal mucosa are pink and moist without lesions or exudates. Mouth:  Oral mucosa and oropharynx without lesions or exudates.  Teeth in good repair. No tongue deviation Lungs:  Normal respiratory effort, chest expands symmetrically. Lungs are clear to auscultation, no crackles or wheezes. Heart:  regular rhythm, no murmur, no gallop, no rub, no JVD, no HJR, and bradycardia.  S4 Abdomen:  Bowel sounds positive,abdomen soft and non-tender without masses, organomegaly or hernias noted. No AAA palpable Pulses:  R and L carotid  pulses are full and equal bilaterally Extremities:  No clubbing, cyanosis, edema. Neurologic:  alert & oriented X3, cranial  nerves II-XII intact, strength normal in all extremities, sensation intact to light touch, gait normal, DTRs symmetrical and normal, finger-to-nose normal, heel-to-shin normal, and Romberg negative.   Skin:  Intact without suspicious lesions or rashes Psych:  memory intact for  recent and remote, flat affect, and subdued.     Impression & Recommendations:  Problem # 1:  HYPERTENSION (ICD-401.9)  Her updated medication list for this problem includes:    Catapres 0.1 Mg Tabs (Clonidine hcl) .Marland Kitchen... 1 two times a day    Labetalol Hcl 200 Mg Tabs (Labetalol hcl) .Marland Kitchen... 2 by mouth three times a day    Hydrochlorothiazide 12.5 Mg Caps (Hydrochlorothiazide) .Marland Kitchen... Take 1 cap once daily  Problem # 2:  HEADACHE (ICD-784.0)  Her updated medication list for this problem includes:    Labetalol Hcl 200 Mg Tabs (Labetalol hcl) .Marland Kitchen... 2 by mouth three times a day    Acetaminophen-codeine #3 300-30 Mg Tabs (Acetaminophen-codeine) .Marland Kitchen... 1 every 6 hrs as needed pain  Problem # 3:  TRANSIENT ISCHEMIC ATTACK (ICD-435.9) Slurred speech for several minutes 3X/week  Complete Medication List: 1)  Effexor Xr 75 Mg Xr24h-cap (Venlafaxine hcl) .Marland Kitchen.. 1 by mouth once daily 2)  Catapres 0.1 Mg Tabs (Clonidine hcl) .Marland Kitchen.. 1 two times a day 3)  Labetalol Hcl 200 Mg Tabs (Labetalol hcl) .... 2 by mouth three times a day 4)  Vitamin D (ergocalciferol) 50000 Unit Caps (Ergocalciferol) .Marland Kitchen.. 1 by mouth 1 x weekly**take the same day every week** 5)  Hydrochlorothiazide 12.5 Mg Caps (Hydrochlorothiazide) .... Take 1 cap once daily 6)  Acetaminophen-codeine #3 300-30 Mg Tabs (Acetaminophen-codeine) .Marland Kitchen.. 1 every 6 hrs as needed pain 7)  Neurontin 100 Mg Caps (Gabapentin) .Marland Kitchen.. 1 every 8 hrs as needed headaches  Hypertension Assessment/Plan:      The patient's hypertensive risk group is category C: Target organ damage and/or diabetes.  Today's blood pressure is 116/70.    Patient Instructions: 1)  Schedule fasting Lipids, BUN,creat, K+.Take an 81 mg COATED  Aspirin every day because of speech issues 3 weeks ago. Prescriptions: NEURONTIN 100 MG CAPS (GABAPENTIN) 1 every 8 hrs as needed headaches  #30 x 0   Entered and Authorized by:   Marga Melnick MD   Signed by:   Marga Melnick MD on 03/24/2010    Method used:   Print then Give to Patient   RxID:   [id number] ACETAMINOPHEN-CODEINE #3 300-30 MG TABS (ACETAMINOPHEN-CODEINE) 1 every 6 hrs as needed pain  #30 x 0   Entered and Authorized by:   Marga Melnick MD   Signed by:   Marga Melnick MD on 03/24/2010   Method used:   Print then Give to Patient   RxID:   [id number] CATAPRES 0.1 MG TABS (CLONIDINE HCL) 1 two times a day  #180 x 1   Entered and Authorized by:   Marga Melnick MD   Signed by:   Marga Melnick MD on 03/24/2010   Method used:   Print then Give to Patient   RxID:   [id number]

## 2010-11-30 NOTE — Letter (Signed)
Summary: [facility]  [facility]   Imported By: Lanelle Bal 05/08/2010 09:22:48  _____________________________________________________________________  External Attachment:    Type:   Image     Comment:   External Document

## 2010-11-30 NOTE — Progress Notes (Signed)
Summary: topamax refill   Phone Note Call from Patient Call back at Home Phone [phone]   Caller: Patient Reason for Call: Refill Medication Summary of Call: Patient was calling to have her Topamax refilled. [place] pharmacy [place] [facility] [address]  Initial call taken by: Harold Barban,  May 02, 2010 11:20 AM    Prescriptions: TOPAMAX 25 MG TABS (TOPIRAMATE) 1 once daily X 1 week then two times a day.  #60 x 0   Entered by:   Doristine Devoid   Authorized by:   Marga Melnick MD   Signed by:   Doristine Devoid on 05/02/2010   Method used:   [facility]        [facility] [phone]* (retail)       [address] [place], [place]  [address]       Ph: [phone] or [phone]       Fax: [phone]   RxID:   [id number]

## 2010-11-30 NOTE — Progress Notes (Signed)
Summary: med change  Phone Note Call from Patient Call back at Home Phone [phone]   Caller: Patient Summary of Call: pt still c/o leg cramps that she thought was related to HCTZ. pt states that this med was D/C but she continue to have the leg cramps. pt would like to go back HCTZ because she does not like the way Spironolactone 25 Mg Tabs make her feel. [facility] [address] pls advise........................Marland KitchenFelecia Deloach CMA  January 02, 2010 1:01 PM   Follow-up for Phone Call        per dr Aubrey Voong ok #90 HCTZ recheck k+ after 8-10 weeks after being on med............Marland KitchenFelecia Deloach CMA  January 02, 2010 2:56 PM   left pt detail message rx sent to pharmacy and to call to schedule labs 8 to 10 weeks after starting med.............Marland KitchenFelecia Deloach CMA  January 02, 2010 3:01 PM     New/Updated Medications: HYDROCHLOROTHIAZIDE 12.5 MG CAPS (HYDROCHLOROTHIAZIDE) take 1 cap once daily Prescriptions: HYDROCHLOROTHIAZIDE 12.5 MG CAPS (HYDROCHLOROTHIAZIDE) take 1 cap once daily  #90 x 0   Entered by:   Jeremy Johann CMA   Authorized by:   Marga Melnick MD   Signed by:   Jeremy Johann CMA on 01/02/2010   Method used:   F[facility]ed to ...       [facility]  [address] [phone]* (retail)       [address] [place], [place]  [address]       Ph: [phone] or [phone]       Fax: [phone]   RxID:   [id number]

## 2010-11-30 NOTE — Letter (Signed)
Summary: Work Dietitian at [employer]  [address] [place], [place] [address]   Phone: [phone]  Fax: [phone]    Today's Date: December 14, 2009  Name of Patient: Patricia Diaz  The above named patient had a medical visit today at:  am / 4:00pm.  Please take this into consideration when reviewing the time away from work/school.    Special Instructions:  [x ] None  [  ] To be off the remainder of today, returning to the normal work / school schedule tomorrow.  [  ] To be off until the next scheduled appointment on ______________________.  [  ] Other ________________________________________________________________ ________________________________________________________________________   Sincerely yours,   Shonna Chock

## 2010-11-30 NOTE — Progress Notes (Signed)
Summary: Medication  Phone Note Call from Patient Call back at Home Phone [phone]   Caller: Patient Summary of Call: Pt called and stated that the Acetaminophen-codeine makes her nauseas, she would like hydrocodone. Pt also states she does not want to take neurotin, she would rather take Topamax. [facility] [address]. Army Fossa CMA  March 29, 2010 4:29 PM   Follow-up for Phone Call        If Tylenol with codeine causes nausea , hydrocodone would also cause nausea. It is the codeine component which causes this. Topamax 25 mg # 60 , 1 once daily X 1 week then two times a day. [facility] if headaches persist or progress  Follow-up by: Marga Melnick MD,  March 29, 2010 4:42 PM  Additional Follow-up for Phone Call Additional follow up Details #1::        Left message for pt to call back. Army Fossa CMA  March 29, 2010 4:45 PM     Additional Follow-up for Phone Call Additional follow up Details #2::    Pt is aware. Army Fossa CMA  March 29, 2010 4:57 PM   New/Updated Medications: TOPAMAX 25 MG TABS (TOPIRAMATE) 1 once daily X 1 week then two times a day. Prescriptions: TOPAMAX 25 MG TABS (TOPIRAMATE) 1 once daily X 1 week then two times a day.  #60 x 0   Entered by:   Army Fossa CMA   Authorized by:   Marga Melnick MD   Signed by:   Army Fossa CMA on 03/29/2010   Method used:   [facility] [phone][facility] (retail[facility]       [address] [place], [place]  [address]       Ph: [phone] or [phone]       Fax: [phone]   RxID:   [id number]

## 2010-11-30 NOTE — Letter (Signed)
Summary: [facility]  [facility]   Imported By: Lanelle Bal 04/27/2010 15:01:45  _____________________________________________________________________  External Attachment:    Type:   Image     Comment:   External Document

## 2010-11-30 NOTE — Progress Notes (Signed)
Summary: Refill Request  Phone Note Refill Request Message from:  [facility] [address] Fax #: [phone]  Refills Requested: Medication #1:  LABETALOL HCL 200 MG TABS 2 by mouth three times a day   Dosage confirmed as above?Dosage Confirmed   Supply Requested: 1 month   Last Refilled: 01/22/2010 Next Appointment Scheduled: none Initial call taken by: Harold Barban,  February 20, 2010 9:33 AM    Prescriptions: LABETALOL HCL 200 MG TABS (LABETALOL HCL) 2 by mouth three times a day  #180 Tablet x 2   Entered by:   Shonna Chock   Authorized by:   Marga Melnick MD   Signed by:   Shonna Chock on 02/20/2010   Method used:   [facility] [phone]* (retail)       [address] [place], [place]  [address]       Ph: [phone] or [phone]       Fax: [phone]   RxID:   [id number]

## 2011-03-13 NOTE — Consult Note (Signed)
NAMEMARIN, WISNER            ACCOUNT NO.:  [id number]   MEDICAL RECORD NO.:  [id number]          PATIENT TYPE:  INP   LOCATION:  2316                         FACILITY:  [facility]   PHYSICIAN:  Salvatore Decent. Cornelius Moras, M.D. DATE OF BIRTH:  [birth year]   DATE OF CONSULTATION:  07/05/2008  DATE OF DISCHARGE:                                 CONSULTATION   REASON FOR CONSULTATION:  Acute type B aortic dissection.   HISTORY OF PRESENT ILLNESS:  The patient is a 55 year old female with  fairly unremarkable past medical history who developed sudden onset of  substernal chest pain located in the left side of the chest radiating to  her back yesterday evening.  The patient reports the pain was not  associated with physical activity and nothing she could do that seem to  provide any significant relief.  There was no associated shortness of  breath, nausea, or vomiting.  Pain did not radiate to the arms.  The  patient presented to the emergency department and was subsequently  admitted to the hospital with atypical chest pain.  Electrocardiograms  were unremarkable and serial cardiac enzymes were negative.  The patient  underwent a chest CT scan this afternoon to rule out pulmonary embolus.  This revealed acute type B aortic dissection.  Cardiothoracic surgical  consultation was requested.   PAST MEDICAL HISTORY:  Notable for the absence of any history of  hypertension, ischemic heart disease, diabetes, and peripheral vascular  disease.  The patient has been treated for depression recently.   PAST SURGICAL HISTORY:  Notable for previous hysterectomy, foot surgery,  and lumbar spine fusion.   FAMILY HISTORY:  Notable for the fact that father died of myocardial  infarction in relatively young age.  There is no history of Marfan  syndrome or other collagen vascular disorders.   SOCIAL HISTORY:  The patient is married and lives with her husband here  in [place].  She smokes an occasional  cigarette.  She denies  significant alcohol consumption.   MEDICATIONS PRIOR TO ADMISSION:  None.   DRUG ALLERGIES:  None known.   PHYSICAL EXAM:  GENERAL:  The patient is a well-appearing female who  appears somewhat younger than stated age in no acute distress.  VITAL SIGNS:  Blood pressure is currently 110/60.  She is in normal  sinus rhythm with heart rate 68.  HEENT:  Unrevealing.  NECK:  Supple.  There is no jugular venous distention.  Clear to  auscultation.  CHEST:  Demonstrates clear breath sounds which are symmetrical  bilaterally.  CARDIOVASCULAR:  Regular rate and rhythm.  No murmurs, rubs, or gallops  noted.  ABDOMEN:  Soft, nondistended, and nontender.  Bowel sounds are present.  EXTREMITIES:  Warm and well-perfused.  Femoral pulses are palpable and  strong bilaterally.  Distal pulses are palpable in the posterior tibial  and dorsalis pedis position.  RECTAL and GU:  Deferred.  NEUROLOGIC:  Grossly nonfocal.   DIAGNOSTIC TEST:  CT angiogram of the chest, abdomen, and pelvis were  all reviewed.  These demonstrate the presence of acute type B aortic  dissection with thrombosis  of the majority of false lumen of the  descending thoracic aorta.  The false lumen is patent below the  diaphragm, but the dissection stop just below the level of the renal  arteries.  Both renal arteries in all major visceral vessels arise of  the true lumen.  There are no pleural effusion.  There are no other  complicating factors.   IMPRESSION:  Acute type B aortic dissection without any complicating  features.  The patient is clinically stable with normal blood pressure  at present.  There is no history of hypertension.  There is no history  of trauma.  There is no history of ischemic heart disease or other  complicating features.   PLAN:  I recommend continued medical therapy with close observation in  the intensive care unit for strict blood pressure control.  If blood  pressure and  heart rate will allow, I would favor adding beta blocker to  a medical treatment.  We will continue to follow along and she will  obviously need long-term surveillance follow up.      Salvatore Decent. Cornelius Moras, M.D.  Electronically Signed     CHO/MEDQ  D:  07/05/2008  T:  07/06/2008  Job:  [id number]   cc:   Titus Dubin. Alwyn Ren, MD,FACP,FCCP

## 2011-03-13 NOTE — Consult Note (Signed)
Patricia Diaz, Patricia Diaz            ACCOUNT NO.:  [id number]   MEDICAL RECORD NO.:  [id number]         PATIENT TYPE:  CINP   LOCATION:                               FACILITY:  [facility]   PHYSICIAN:  Madolyn Frieze. Jens Som, MD, FACCDATE OF BIRTH:  1955-11-19   DATE OF CONSULTATION:  07/08/2008  DATE OF DISCHARGE:                                 CONSULTATION   PRIMARY CARE PHYSICIAN:  Titus Dubin. Alwyn Ren, MD, FACP, [facility]   PRIMARY CARDIOLOGIST:  Madolyn Frieze. Jens Som, MD, [facility]   CHIEF COMPLAINT:  Hypertension and chest pain.   HISTORY OF PRESENT ILLNESS:  Patricia Diaz is a 55 year old female with no  history of coronary artery disease.  She had chest pain which started  July 03, 2008.  It did not resolve, so she saw her primary MD and  was sent to the hospital.  Her cardiac enzymes were negative for MI, but  a CT was performed, which showed a type B aortic dissection.  Since  admission, she has had problems with pain control and blood pressure  control.  Her blood pressure is elevated in the setting of increased  pain, but also at other times.  She has never had chest pain before this  episode started.  She had some nausea, vomiting, and diaphoresis  initially as well as little shortness of breath, but these other  symptoms have resolved.  She is currently complaining of left lateral  chest pain, which is worse with deep inspiration.   PAST MEDICAL HISTORY:  1. She has no history of hypertension, diabetes, or hyperlipidemia.  2. Family history of coronary artery disease in her mother.  3. History of tobacco use.  4. Depression.  5. Gastroesophageal reflux disease, hiatal hernia.   SURGICAL HISTORY:  She is status post vaginal hysterectomy, back surgery  x2, foot surgery, jaw surgery, and colonoscopy.   ALLERGIES:  No known drug allergies.   CURRENT MEDICATIONS:  1. Protonix 40 mg q.12 h.  2. Effexor 75 mg daily.  3. Labetalol 400 mg b.i.d.   SOCIAL HISTORY:  She lives in [place] with  her husband and works as a  Sales executive.  She has an approximately 10-pack-year history of  tobacco use and quit in August 2009.  She has never abused alcohol or  drugs.   FAMILY HISTORY:  Her mother died at age 22 with a history of a stroke  and severe poorly-controlled hypertension for years prior to her death.  Her father died with a history of bypass surgery and MI at age 35.  No  siblings have coronary artery disease.   REVIEW OF SYSTEMS:  The chest pain is left lateral and it is not aching.  She had some sort of upper respiratory flu-like symptoms 3 weeks ago,  but these have resolved.  Her depression is fairly well-controlled on  her preadmission medication of Pristiq.  She has occasional arthralgias.  She has occasional bright red blood per rectum and constipation.  Her  reflux symptoms are well-controlled on medication.  Full 14-point review  of systems is otherwise negative.   PHYSICAL EXAMINATION:  VITAL  SIGNS:  Temperature is 98.0, blood pressure  145/63, pulse 57, respiratory rate 18, and O2 saturation 92% on room  air.  GENERAL:  She is a well-developed Asian female in mild distress.  HEENT:  Normal.  NECK:  There is no lymphadenopathy, thyromegaly, bruit, or JVD noted.  CV:  Heart is regular in rate and rhythm with an S1 and S2 and a faint  systolic murmur is noted.  Distal pulses are intact in all four  extremities and no femoral bruits are appreciated.  LUNGS:  Clear to auscultation bilaterally.  SKIN:  No rashes or lesions are noted.  ABDOMEN:  Soft and nontender with active bowel sounds.  EXTREMITIES:  There is no cyanosis, clubbing, or edema noted.  MUSCULOSKELETAL:  There is no joint deformity or effusion and no spine  or CVA tenderness.  NEUROLOGIC:  She is alert and oriented.  Cranial nerves II through XII  are grossly intact.   Chest CT shows a type B aortic dissection that is thrombosed in  descending aorta and extends into the infrarenal area.   There is  scattered ground-glass densities in her lungs.  EKG dated July 05, 2008 shows sinus rhythm at rate 85 with no acute ischemic changes.   LABORATORY VALUES:  CK-MB and troponin I were negative x3.  Hemoglobin  11.0, hematocrit 32, WBC is 5.6, platelets 155.  Sodium 139, potassium  4.1, chloride 103, CO2 31, BUN 7, creatinine 0.58, glucose 106, total  cholesterol 204, triglycerides 148, HDL 47, and LDL 127.   IMPRESSION:  Mr. Zavaleta was seen today by Dr. Jens Som.  She is a 55-  year-old female with no significant past medical history who is being  evaluated for aortic dissection and hypertension.  She had acute onset  of chest, back, and abdominal pain on July 04, 2008, but cardiac  markers remained negative despite prolonged pain.  Her EKG is sinus  rhythm with anterior T-wave inversions.  Her D-dimer was elevated, so a  chest CT was ordered which showed a type B dissection extending just  below the renal artery origin.  The renal mesenteric arteries are all  patent.  Her blood pressure was also noted to be increased and  Cardiology was asked to assist.  She presently complains of left lateral  chest pain, which increases with inspiration, but has no associated  symptoms.  Today, Dr. Cornelius Moras changed from metoprolol to labetalol 400  b.i.d. for better blood pressure control with less bradycardia.  Her  blood pressure after this medication is 114/58 with a heart rate of 62.  She has 2+ carotid, radial, femoral, and DP pulses.  It is appropriate  to continue the labetalol for blood pressure control, which is much  improved at present.  This can be increased p.r.n. if needed.  We will  check an echocardiogram and continue to follow her with you.  Oral pain  medications have also been ordered in hopes that with better pain  control, her blood pressure will be less labile.     Patricia Demark, Patricia Diaz      Madolyn Frieze. Jens Som, MD, [facility]  Electronically Signed   RB/MEDQ  D:   07/08/2008  T:  07/09/2008  Job:  [id number]

## 2011-03-13 NOTE — Assessment & Plan Note (Signed)
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 Laury Axon for evaluation of several weeks of epigastric  abdominal pain which really is periodic in nature and described as a  sharp pain which comes on without precipitation and may last several  hours in duration.  After the acute episodes however she has a constant  dull aching sensation with some mild nausea but no emesis.  Denies true  reflux symptoms, dysphagia or any hepatobiliary problems and/or history  of hepatitis or pancreatitis.  Her appetite is good and her weight is  stable.  She does have chronic functional constipation and has to take  laxatives on occasions.  She has had some periodic hemorrhoidal  bleeding, also gives a history of colon polyps removed by [facility] some 2 years ago.  We do not have the report but have  requested such.  Patient relates that she was told to have a colonoscopy  every year.   The patient denies abuse of alcohol, cigarettes or NSAIDs.  She has  never been diagnosed with previous peptic ulcer disease.  She does not  have nocturnal awakening.  She has had no melena or hematochezia.   PAST MEDICAL HISTORY:  1. Chronic anxiety and depression.  2. Had hysterectomy in 2002.   MEDICATIONS:  Lexapro 20 mg a day.   SHE DENIES DRUG ALLERGIES.   FAMILY HISTORY:  Noncontributory.   SOCIAL HISTORY:  She is married and lives with her husband.  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  She does not smoke or abuse ethanol.   REVIEW OF SYSTEMS:  Noncontributory without any current cardiovascular,  pulmonary, genitourinary, neurologic, orthopedic or endocrine  problems.  She says her anxiety and depression is under control with Lexapro.   EXAMINATION:  She is attractive, healthy-appearing white female  appearing her stated age.  She is 5 foot 6-1/2 and weighs 176 pounds.  Blood pressure 110/82 and pulse was 74 and regular.  CHEST:  Clear and she was in a regular rhythm without murmurs, gallops  or rubs.  There was no hepatosplenomegaly, abdominal masses but there was slight  tenderness in the epigastric area.  Bowel sounds were normal.  Peripheral extremities were unremarkable.  Mental status was clear.  Inspection of the rectum was unremarkable as was rectal exam.  There was  formed stool present that was guaiac negative.   Patient has recent abdominal ultrasound exam which was unremarkable.   ASSESSMENT:  1. Patient certainly has epigastric pain and upper gastrointestinal      symptoms.  I suspect she has possible Helicobacter infection and      peptic ulcer disease.  2. History of recurrent rectal bleeding and history of previous colon      polyps.  3. Chronic anxiety and depression on  Lexapro.  4. Status post hysterectomy.   RECOMMENDATIONS:  1. Outpatient endoscopy and followup colonoscopy.  2. Anti-reflux diet and will start daily PPI therapy before breakfast      and twice a day as needed.  3. Screening laboratory parameters.  4. Continue Lexapro therapy.     Vania Rea. Jarold Motto, MD, [facility] Essex, FAGA  Electronically Signed    DRP/MedQ  DD: 06/27/2007  DT: 06/28/2007  Job #: [id number]   cc:   Titus Dubin. Alwyn Ren, MD,FACP,FCCP

## 2011-03-13 NOTE — Discharge Summary (Signed)
Patricia Diaz, Patricia Diaz            ACCOUNT NO.:  [id number]   MEDICAL RECORD NO.:  [id number]          PATIENT TYPE:  INP   LOCATION:  2040                         FACILITY:  [facility]   PHYSICIAN:  Raenette Rover. Felicity Coyer, MDDATE OF BIRTH:  1956-02-14   DATE OF ADMISSION:  07/04/2008  DATE OF DISCHARGE:  07/13/2008                               DISCHARGE SUMMARY   DISCHARGE DIAGNOSES:  1. 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  2. Dyslipidemia.  3. Depression.   HISTORY OF PRESENT ILLNESS:  Patricia Diaz is a 54 year old female who was  admitted on July 04, 2008, with chief complaint of severe intense  cramping in her chest with abdominal and back pain.  This lasted several  minutes and was associated with nausea, vomiting, and diuresis.  She was  seen in the [facility] Department and transferred to [facility] for admission and further evaluation.   PAST MEDICAL HISTORY:  1. Depression, followed by Dr. Andee Poles.  2. GERD/hiatal hernia.   COURSE OF HOSPITALIZATION:  Acute descending thoracic aortic dissection,  type B.  The patient was admitted and underwent a CT of the chest due to  elevated D-dimer to rule out PE.  CT of the chest showed no PE, but did  note thrombosed type B dissection of the descending thoracic aorta  versus intramural hematoma.  A cardiothoracic and vascular consult was  requested during this admission and the patient was seen by Dr. Cornelius Moras and  Dr. Darrick Penna.  They felt that surgery was not indicated, but that she  needed strict blood pressure control and management.  She was monitored  in the ICU for several days prior to being transferred to the floor.  She remained hemodynamically stable.  She had some difficulty with blood  pressure control and was also seen in consultation by [facility].  She had some issues with pain control during this  admission.  At the time of  discharge, plan was to continue OxyContin and  Percocet with close outpatient followup of blood pressure.   MEDICATIONS AT THE TIME OF DISCHARGE:  1. Ambien 5 mg p.o. daily.  2. Labetalol 400 mg p.o. b.i.d.  3. Colace 200 mg p.o. daily.  4. Catapres 0.1 mg p.o. nightly.  5. OxyContin 15 mg p.o. b.i.d.  6. Percocet 5/325 two tablets every 4 hours as needed.   DISPOSITION:  The patient was discharged to home.   FOLLOWUP:  The patient was instructed to follow up with Dr. Alwyn Ren on  Tuesday, July 20, 2008, at 2 p.m. and with Dr. Cornelius Moras and contact the  office for an appointment.  She was also instructed to hold Pristiq  until further instructions from Dr. Alwyn Ren and to contact Dr. Alwyn Ren  should she develop systolic pressure greater than 125 or go  to the ER if she develops worsening pain, weakness, shortness of breath,  or severely high BP.  She was also instructed to maintain low-  cholesterol heart-healthy diet.   Greater than 30 minutes were spent on discharge planning.  Sandford Craze, NP      Raenette Rover. Felicity Coyer, MD  Electronically Signed    MO/MEDQ  D:  08/11/2008  T:  08/12/2008  Job:  [id number]

## 2011-03-16 NOTE — Op Note (Signed)
[facility]  Patient:    Patricia Diaz, Patricia Diaz Visit Number: [id number] MRN: [id number]          Service Type: SUR Location: [id number] Attending Physician:  Soledad Gerlach Dictated by:   Guy Sandifer Arleta Creek, M.D. Proc. Date: 12/08/01 Admit Date:  12/08/2001                             Operative Report  PREOPERATIVE DIAGNOSES: 1. Uterine leiomyomata. 2. Menorrhagia.  POSTOPERATIVE DIAGNOSES: 1. Uterine leiomyomata. 2. Endometriosis.  PROCEDURE:  Laparoscopic-assisted vaginal hysterectomy with right salpingo-oophorectomy and ablation of endometriosis.  SURGEON:  Guy Sandifer. Arleta Creek, M.D.  ASSISTANT:  Trevor Iha, M.D.  ANESTHESIA:  General with endotracheal intubation.  ESTIMATED BLOOD LOSS:  200 cc.  INDICATIONS AND CONSENT:  This patient is a 55 year old white female, G3, P1, with uterine leiomyomata and menorrhagia.  Details are dictated in the history and physical.  Laparoscopic-assisted vaginal hysterectomy with possible removal of one or both ovaries if abnormal is discussed.  Potential risks and complications have been discussed including but not limited to infection; bowel, bladder, ureteral damage; bleeding requiring transfusion of blood products with possible transfusion reaction, HIV, and hepatitis acquisition; DVT; PE; pneumonia; fistula formation; laparotomy; and postoperative dyspareunia.  All questions have been answered, and consent is signed on the chart.  FINDINGS:  Upper abdomen is grossly normal.  There is a 0.5 cm dark black implant of endometriosis above the left pelvic brim.  There is black, gun powder type implants of endometriosis at the insertion of the left uterosacral ligament, in the middle of the vesicouterine peritoneal fold, and beneath the right ovary.  There is also an adhesion of the right ovary to the pelvic sidewall.  The left ovary and fallopian tube are normal.  The right  fallopian tube is normal.  The uterus contains several subserosal leiomyomata and is approximately 6-8 weeks in size.  The posterior cul-de-sac also contains a single implant of gun powder burn type endometriosis.  DESCRIPTION OF PROCEDURE:  The patient is taken to the operating room where she is placed where she is placed in the dorsal supine position.  General anesthesia via endotracheal intubation has been undertaken.  She is then placed in the dorsal lithotomy position where she is prepped abdominally and vaginally.  The bladder is straight catheterized.  Hulka tenaculum was placed in the uterus as a manipulator, and she is draped in a sterile fashion.  A small infraumbilical incision is made, and the 12 mm disposable trocar sleeve is placed without difficulty.  Placement is verified with the laparoscope, and no damage to surrounding structures is noted.  Pneumoperitoneum is induced and after careful transillumination, a suprapubic and left lower quadrant incisions are made, and 5 mm disposable trocar sleeves are placed under direct visualization without difficulty.  The above findings are noted.  The course of the ureters is identified bilaterally and found to be clear of the areas of surgery.  The right ovary is released from the pelvic sidewall with sharp dissection without difficulty.  The areas of endometriosis are cauterized with bipolar cautery, and irrigation is carried out.  Then, using the 5 mm laparoscope to the left lower trocar sleeve, the Ligasure instrument is used to take down the right infundibulopelvic ligament as well as the right round ligament and later the left proximal ligaments.  Then, changing back to the operative laparoscope, good hemostasis is  verified.  The vesicouterine peritoneum is incised in the midline.  Hydrodissection is carried out, the vesicouterine peritoneum is taken down in a cephalolateral manner.  The implants of endometriosis that are now on  the bladder flap are cauterized with bipolar cautery.  Instruments are removed, and attention is turned to the vagina.  Posterior vaginal fornix is entered sharply.  The cervix is circumscribed with a scalpel, and the mucosa is advanced sharply and bluntly. The uterosacral ligaments are then taken down bilaterally with the Ligasure instrument.  Progressive bites of the bladder pillars, cardinal ligaments, and uterine vessels are taken bilaterally without difficulty.  The fundus with adherent right tube and ovary is delivered posteriorly, and the proximal ligaments are taken down with the Ligasure.  Then, using 0 Monocryl suture, the uterosacral ligaments are plicated to the vaginal cuff bilaterally.  The cuff is also closed at the 4 oclock and 8 oclock positions with an additional suture.  The uterosacral ligaments are then plicated in the midline with a single suture.  The cuff is then closed with figure-of-eights, and good hemostasis is noted.  Foley catheter is placed, and clear urine is noted. Attention is returned to the abdomen.  Irrigation is carried out.  Bleeding at the peritoneal edges is controlled with bipolar cautery without difficulty. Excess fluid is removed.  Inferior trocar sleeves are removed. Pneumoperitoneum is reduced, and no bleeding is noted from any site.  The pneumoperitoneum is completely reduced, and the umbilical trocar sleeve is removed.  Then 0 Vicryl suture is used with a single stitch to reapproximate the umbilical incision and the deeper underlying layers with care being taken not to pick up any underlying strictures.  The incisions are then injected with 0.5% plain Marcaine.  Dermabond is then used to close all of the incisions.  All counts are correct.  The patient is awakened and taken to the recovery room in stable condition. Dictated by:   Guy Sandifer Arleta Creek, M.D. Attending Physician:  Soledad Gerlach DD:  12/08/01 TD:  12/08/01 Job:  [id number]

## 2011-03-16 NOTE — Letter (Signed)
June 12, 2006     Guy Sandifer. Henderson Cloud, M.D.  [facility], [place]  [address], [address]  [place], [place] [address]   RE:  Patricia Diaz, Patricia Diaz  MRN:  [id number]  /  DOB:  02-26-1956   Dear Rosanne Ashing,   Thank you for your note concerning Patricia Diaz cardiovascular status.  When I saw her on Mar 13, 2006 she was describing intermittent chest pain  for 3-4 months.  It was in the left supramammary area and dull, lasting  several minutes.  It did not radiate and was not associated with nausea or  diaphoresis.  There were no specific triggers and it would usually occur  during the day.  It can also occur when supine.   Her family history is positive for stroke in both parents.   She was employing an elliptical 15-20 minutes two times per week without  symptoms.   EKG revealed nonspecific ST-T wave changes.  Exam was unremarkable.   A nuclear stress test was normal; there were no exercise restrictions.   In May her LDL was 135 with an HDL of 47.  Her hemoglobin A1c was 5.6.  I  did recommend carbohydrate restriction such as [facility].  After following  this diet for 4-6 months I recommended that she have a special cholesterol  panel performed to optimally assess her risk.  This is scheduled for  October at this time.  Her hemoglobin A1c can be repeated at that time.   I do encourage women to control their cardiovascular risk if they are going  to take hormone replacement.  I insist that they follow up with their  gynecologist and practice surveillance with self breast exams and mammograms  annually.  As long as they have such a close monitor, I do not have major  concerns.  I explained that there is a small increased risk in women who  have strong family history or reversible cardiovascular risk.   I appreciate your diligence and compassionate care.  I look forward to  working with you in serving Patricia Diaz in the future.    Sincerely,      Titus Dubin. Alwyn Ren, MD, FACP, [facility]   [facility]/Med[facility]  DD:  06/12/2006  DT:  06/12/2006  Job #:  [id number]

## 2011-03-16 NOTE — H&P (Signed)
[facility]  Patient:    Patricia Diaz, Patricia Diaz Visit Number: [id number] MRN: [id number]          Service Type: Attending:  Guy Sandifer. Arleta Creek, M.D. Dictated by:   Guy Sandifer Arleta Creek, M.D. Adm. Date:  12/08/01                           History and Physical  CHIEF COMPLAINT:  Uterine fibroids and heavy vaginal bleeding.  HISTORY OF PRESENT ILLNESS:  The patient is a 55 year old married white female, G3, P1, with known uterine leiomyomata.  Fibroids are noted on pelvic ultrasound on August 21, 2000.  Uterus is upper limits of normal size. Endometrial biopsy on December 12, 2000, is benign.  She has had recurrent breakthrough bleeding which is quite heavy with clots most months.  She has had failure of at least three different birth control pills to control this. After consideration of the options, she is being admitted for laparoscopic-assisted vaginal hysterectomy and removal of one or both ovaries if abnormal.  PAST MEDICAL HISTORY:  Depression.  PAST SURGICAL HISTORY: 1. Liposuction four months ago and one year ago. 2. Jaw surgery in 1989. 3. Back surgery in 1994 and 1996. 4. Foot surgery 1-2 years ago.  MEDICATIONS:  Lexapro 10 mg q.d.  ALLERGIES:  No known drug allergies.  SOCIAL HISTORY:  The patient denies tobacco, alcohol, or drug abuse.  FAMILY HISTORY:  Noncontributory.  REVIEW OF SYSTEMS:  Negative except as above.  PHYSICAL EXAMINATION:  VITAL SIGNS:  Height 5 feet 6 inches, weight 165 pounds, blood pressure 120/76.  HEENT:  Without thyromegaly.  LUNGS:  Clear to auscultation.  HEART:  Regular rate and rhythm.  BACK:  Without CVA tenderness.  BREASTS:  Without mass, ______ , discharge.  ABDOMEN:  Soft, nontender, without masses.  PELVIC:  Vulva, vagina, and cervix without lesions.  Uterus is retroverted, upper limits of normal size, mobile, and nontender.  Adnexa nontender without masses.  EXTREMITIES:  Grossly  within normal limits.  NEUROLOGIC:  Grossly within normal limits  ASSESSMENT:  Uterine leiomyomata and menometrorrhagia.  PLAN:  Laparoscopic-assisted vaginal hysterectomy and removal of one or both ovaries if abnormal. Dictated by:   Guy Sandifer. Arleta Creek, M.D. Attending:  Guy Sandifer Arleta Creek, M.D. DD:  12/03/01 TD:  12/03/01 Job: [id number]

## 2011-06-18 ENCOUNTER — Other Ambulatory Visit: Payer: Self-pay | Admitting: Internal Medicine

## 2011-08-01 LAB — COMPREHENSIVE METABOLIC PANEL
AST: 17
AST: 25
Albumin: 2.7 — ABNORMAL LOW
Alkaline Phosphatase: 64
BUN: 2 — ABNORMAL LOW
Calcium: 8.3 — ABNORMAL LOW
Calcium: 9.1
Chloride: 103
Creatinine, Ser: 0.58
GFR calc Af Amer: >60
GFR calc Af Amer: >60
GFR calc non Af Amer: >60
Sodium: 141
Total Bilirubin: 1.2
Total Protein: 6.2

## 2011-08-01 LAB — CBC
HCT: 34 — ABNORMAL LOW
HCT: 38.9
Hemoglobin: 11.6 — ABNORMAL LOW
Hemoglobin: 13.3
MCHC: 34.1
MCHC: 34.1
MCHC: 34.3
MCV: 89.9
MCV: 91.1
MCV: 91.5
MCV: 91.7
Platelets: 132 — ABNORMAL LOW
RDW: 11.9
RDW: 12.6
WBC: 5.5

## 2011-08-01 LAB — CK TOTAL AND CKMB (NOT AT ARMC)
CK, MB: 1.2
CK, MB: 1.4
CK, MB: 1.6
Relative Index: 1.2
Relative Index: INVALID
Total CK: 113
Total CK: 54

## 2011-08-01 LAB — BASIC METABOLIC PANEL
BUN: 3 — ABNORMAL LOW
BUN: 7
CO2: 31
Calcium: 8.7
Chloride: 100
Chloride: 103
Creatinine, Ser: 0.58
GFR calc Af Amer: >60
GFR calc non Af Amer: >60
Glucose, Bld: 103 — ABNORMAL HIGH
Glucose, Bld: 106 — ABNORMAL HIGH
Potassium: 3.5
Sodium: 138

## 2011-08-01 LAB — DIFFERENTIAL
Eosinophils Absolute: 0.1
Eosinophils Relative: 2
Lymphocytes Relative: 20

## 2011-08-01 LAB — AMYLASE: Amylase: 29

## 2011-08-01 LAB — POCT CARDIAC MARKERS
CKMB, poc: <1 — ABNORMAL LOW
Myoglobin, poc: 39.8
Troponin i, poc: <0.05

## 2011-08-01 LAB — LIPID PANEL
LDL Cholesterol: 127 — ABNORMAL HIGH
VLDL: 30

## 2011-08-01 LAB — HEMOGLOBIN A1C: Hgb A1c MFr Bld: 5.8

## 2011-08-01 LAB — HEMOGLOBIN AND HEMATOCRIT, BLOOD: Hemoglobin: 10.8 — ABNORMAL LOW

## 2011-08-01 LAB — MAGNESIUM: Magnesium: 1.9

## 2011-08-27 ENCOUNTER — Other Ambulatory Visit: Payer: Self-pay | Admitting: Internal Medicine

## 2011-08-28 NOTE — Telephone Encounter (Signed)
Left message on voicemail for patient to return call to discuss instruction for Labetalol

## 2011-10-25 ENCOUNTER — Encounter: Payer: Self-pay | Admitting: Internal Medicine

## 2011-10-25 ENCOUNTER — Ambulatory Visit: Payer: Self-pay | Admitting: Internal Medicine

## 2011-10-25 ENCOUNTER — Ambulatory Visit (INDEPENDENT_AMBULATORY_CARE_PROVIDER_SITE_OTHER): Payer: BC Managed Care – PPO | Admitting: Internal Medicine

## 2011-10-25 DIAGNOSIS — J069 Acute upper respiratory infection, unspecified: Secondary | ICD-10-CM

## 2011-10-25 MED ORDER — HYDROCOD POLST-CHLORPHEN POLST 10-8 MG/5ML PO LQCR: 5.0000 mL | Freq: Two times a day (BID) | ORAL | Status: DC | PRN

## 2011-10-25 MED ORDER — DOXYCYCLINE HYCLATE 100 MG PO TABS: 100.0000 mg | ORAL_TABLET | Freq: Two times a day (BID) | ORAL | Status: AC

## 2011-10-26 ENCOUNTER — Encounter: Payer: Self-pay | Admitting: Internal Medicine

## 2011-10-26 DIAGNOSIS — J069 Acute upper respiratory infection, unspecified: Secondary | ICD-10-CM | POA: Insufficient documentation

## 2011-10-26 NOTE — Assessment & Plan Note (Signed)
Attempt tussionex prn-cautioned re possible sedating effect. Given abx to hold-begin abx if no improvement of sx's after total duration of 8-10 days. Followup if no improvement or worsening.

## 2011-10-26 NOTE — Progress Notes (Signed)
  Subjective:    Patient ID: Patricia Diaz, female    DOB: 1956/02/20, 55 y.o.   MRN: [id number]  HPI Pt presents to clinic for evaluation of cough. Notes 5d h/o cough productive for yellow sputum without hemoptysis. +fever with tmax 101 and throat irritation. No alleviating or exacerbating factors. No other complaints.   No past medical history on file. No past surgical history on file.  reports that she has never smoked. She has never used smokeless tobacco. She reports that she drinks alcohol. She reports that she does not use illicit drugs. family history is not on file. No Known Allergies   Review of Systems see hpi     Objective:   Physical Exam  Nursing note and vitals reviewed. Constitutional: She appears well-developed and well-nourished. No distress.  HENT:  Head: Normocephalic and atraumatic.  Right Ear: External ear normal.  Left Ear: External ear normal.  Nose: Nose normal.  Mouth/Throat: Oropharynx is clear and moist. No oropharyngeal exudate.  Eyes: Conjunctivae are normal. Right eye exhibits no discharge. Left eye exhibits no discharge. No scleral icterus.  Neck: Neck supple.  Pulmonary/Chest: Effort normal and breath sounds normal. No respiratory distress. She has no wheezes. She has no rales.  Neurological: She is alert.  Skin: Skin is warm and dry. She is not diaphoretic.  Psychiatric: She has a normal mood and affect.          Assessment & Plan:

## 2011-10-31 ENCOUNTER — Telehealth: Payer: Self-pay | Admitting: Internal Medicine

## 2011-10-31 MED ORDER — HYDROCOD POLST-CHLORPHEN POLST 10-8 MG/5ML PO LQCR: 5.0000 mL | Freq: Two times a day (BID) | ORAL | Status: DC | PRN

## 2011-10-31 NOTE — Telephone Encounter (Signed)
Left detailed message on voicemail re: instructions below.

## 2011-10-31 NOTE — Telephone Encounter (Signed)
Pt states she started feeling worse over the weekend so she started taking her abx on Saturday. States she has 1 dose of Tussionex left and would like a refill. Please advise.

## 2011-10-31 NOTE — Telephone Encounter (Signed)
Rx refill called in.  She will need to be seen back in the office however if her cough does not improve in the next few days or if she develops fever.

## 2011-10-31 NOTE — Telephone Encounter (Signed)
Patient states that she saw dr. Rodena Medin last week. He prescribed an antibiotic and cough medicine for her. Patient states that she is almost out of cough medicine and is still coughing. Would like more cough medicine to be called in to [facility] on [address] in [place]

## 2011-11-10 ENCOUNTER — Other Ambulatory Visit: Payer: Self-pay | Admitting: Internal Medicine

## 2011-12-04 ENCOUNTER — Other Ambulatory Visit: Payer: Self-pay | Admitting: Internal Medicine

## 2011-12-04 NOTE — Telephone Encounter (Signed)
Patient needs to schedule a CPX  

## 2011-12-19 ENCOUNTER — Other Ambulatory Visit: Payer: Self-pay | Admitting: Internal Medicine

## 2011-12-19 NOTE — Telephone Encounter (Signed)
Patient needs to schedule a CPX  

## 2012-01-15 ENCOUNTER — Telehealth: Payer: Self-pay | Admitting: Internal Medicine

## 2012-01-15 NOTE — Telephone Encounter (Signed)
Patient has an appointment on 01/16/12--[facility]  [facility] Triage Call Report Triage Record Num: [id number] Operator: Tomasita Crumble Patient Name: [facility] Call Date & Time: 01/15/2012 1:43:18PM Patient Phone: [phone] PCP: Patient Gender: Female PCP Fax : Patient DOB: 01-18-1956 Practice Name: [facility] Reason for Call: Caller: Jone/Patient; PCP: Marga Melnick; CB#: [phone]; ; ; Call regarding fall w/ injury to ribs and hip on right side. Emergent sx ruled out. Home care for the interim and parameters for callback given per [facility] protocol. Requestas appointment for 01/16/12. Emergent sx ruled out. Transferred to office for appointment. Spoke w/ Johnathan Hausen for assistance. Protocol(s) Used: [facility] Recommended Outcome per Protocol: Provide Home/Self Care Reason for Outcome: History of falls due to environmental factors AND had previous evaluation Care Advice: ~

## 2012-01-15 NOTE — Telephone Encounter (Signed)
I spoke with patient and C-A-N on a conference call and schedule appointment for tomorrow per patient request

## 2012-01-16 ENCOUNTER — Ambulatory Visit (HOSPITAL_BASED_OUTPATIENT_CLINIC_OR_DEPARTMENT_OTHER)
Admission: RE | Admit: 2012-01-16 | Discharge: 2012-01-16 | Disposition: A | Discharge status: Home / Self Care | Payer: BC Managed Care – PPO | Source: Ambulatory Visit | Attending: Internal Medicine | Admitting: Internal Medicine

## 2012-01-16 ENCOUNTER — Ambulatory Visit (INDEPENDENT_AMBULATORY_CARE_PROVIDER_SITE_OTHER): Payer: BC Managed Care – PPO | Admitting: Internal Medicine

## 2012-01-16 ENCOUNTER — Encounter: Payer: Self-pay | Admitting: Internal Medicine

## 2012-01-16 VITALS — BP 110/64 | HR 66 | Wt 146.0 lb

## 2012-01-16 DIAGNOSIS — I712 Thoracic aortic aneurysm, without rupture, unspecified: Secondary | ICD-10-CM

## 2012-01-16 DIAGNOSIS — W19XXXA Unspecified fall, initial encounter: Secondary | ICD-10-CM

## 2012-01-16 DIAGNOSIS — J90 Pleural effusion, not elsewhere classified: Secondary | ICD-10-CM

## 2012-01-16 DIAGNOSIS — S79929A Unspecified injury of unspecified thigh, initial encounter: Secondary | ICD-10-CM

## 2012-01-16 DIAGNOSIS — S298XXA Other specified injuries of thorax, initial encounter: Secondary | ICD-10-CM

## 2012-01-16 DIAGNOSIS — S79919A Unspecified injury of unspecified hip, initial encounter: Secondary | ICD-10-CM

## 2012-01-16 DIAGNOSIS — S299XXA Unspecified injury of thorax, initial encounter: Secondary | ICD-10-CM

## 2012-01-16 DIAGNOSIS — R0789 Other chest pain: Secondary | ICD-10-CM | POA: Insufficient documentation

## 2012-01-16 DIAGNOSIS — R209 Unspecified disturbances of skin sensation: Secondary | ICD-10-CM

## 2012-01-16 DIAGNOSIS — R071 Chest pain on breathing: Secondary | ICD-10-CM

## 2012-01-16 DIAGNOSIS — R079 Chest pain, unspecified: Secondary | ICD-10-CM

## 2012-01-16 DIAGNOSIS — Z8669 Personal history of other diseases of the nervous system and sense organs: Secondary | ICD-10-CM

## 2012-01-16 MED ORDER — TOPIRAMATE 100 MG PO TABS: 100.0000 mg | ORAL_TABLET | Freq: Two times a day (BID) | ORAL | Status: DC

## 2012-01-16 MED ORDER — TOPIRAMATE 100 MG PO TABS: 100.0000 mg | ORAL_TABLET | Freq: Every day | ORAL | Status: DC

## 2012-01-16 MED ORDER — HYDROCODONE-ACETAMINOPHEN 7.5-500 MG PO TABS: 1.0000 | ORAL_TABLET | ORAL | Status: AC | PRN

## 2012-01-16 NOTE — Patient Instructions (Addendum)
Order for x-rays entered into  the computer; these will be performed at [facility] No appointment is necessary .Share results with Dr Everardo Beals. Please keep a diary of your headaches . Document  each occurrence on the calendar with notation of : #1 any prodrome ( any non headache symptom such as marked fatigue,visual changes, ,etc ) which precedes actual headache ; #2) severity on 1-10 scale; #3) any triggers ( food/ drink,enviromenntal or weather changes ,physical or emotional stress) in 8-12 hour period prior to the headache; & #4) response to any medications or other intervention. Please review "Headache" @ [facility] for additional information.

## 2012-01-16 NOTE — Progress Notes (Signed)
  Subjective:    Patient ID: Patricia Diaz, female    DOB: 02/13/1956, 56 y.o.   MRN: [id number]  HPI Thorax& hip pain Location:R hip & R inframammary area Onset:01/14/12 Trigger/injury:fall from elevated bed onto bed stairs Pain quality:sharp Pain severity:up to 6 Duration:constant  Radiation:no Exacerbating factors:reaching , sneezing, expectorating Treatment/response:no Rx Review of systems: Constitutional: no fever, chills, sweats, change in weight  Musculoskeletal:no  muscle cramps or pain; no  joint stiffness, redness, or swelling Skin:no rash. Ecchymosis R hip Neuro: no weakness; incontinence (stool/urine); numbness and tingling Heme:no lymphadenopathy; abnormal bruising or bleeding   She does have a past history of lumbar fusion      Review of Systems there is tenderness in the area of ecchymosis at the right hip. The hip does not hurt unless there  is direct pressure     Objective:   Physical Exam Gen.: Thin but healthy and well-nourished in appearance. Alert, appropriate and cooperative throughout exam.Appearsuncomfortable  Eyes: No corneal or conjunctival inflammation noted.  Neck: No deformities, masses, or tenderness noted.  Lungs: Normal respiratory effort; chest expands symmetrically. Lungs are clear to auscultation without rales, wheezes, or increased work of breathing. She splints on the right; no rub is auscultated. She is tender to palpation over the right inframammary ,anterior thorax. Heart: Normal rate and rhythm. Normal S1 and S2. No gallop, click, or rub. No murmur. Abdomen: Bowel sounds normal; abdomen soft and nontender. No masses, organomegaly or hernias noted.                                   Musculoskeletal/extremities: No deformity or scoliosis noted of  the thoracic or lumbar spine.LS op scar well healed. No clubbing, cyanosis, edema, or deformity noted. Range of motion  Normal @ hips w/o pain .Tone & strength  normal.Joints normal. Nail health   good. She lies down gingerly  & "crawls " back up due to  the right rib pain Vascular: Carotid, radial artery, dorsalis pedis and  posterior tibial pulses are full and equal. No bruits present. Neurologic: Alert and oriented x3. Deep tendon reflexes symmetrical and normal.          Skin: Intact without suspicious lesions or rashes. There is a 2 x 1.5 cm faint ecchymotic area over the right lateral hip. Lymph: No cervical, axillary lymphadenopathy present. Psych: Mood and affect are normal. Normally interactive                                                                                         Assessment & Plan:  #1 rib pain post fall; rule out fracture and underlying pulmonary contusion. Exam and O2 sats suggests the latter is not present  #2 hip pain post fall. Clinically no significant injury present  #3 thoracic aortic aneurysm monitored at [facility]. Blood pressures are at goal to prevent pressure related aorta trauma.

## 2012-03-28 ENCOUNTER — Other Ambulatory Visit: Payer: Self-pay | Admitting: Internal Medicine

## 2012-07-28 ENCOUNTER — Encounter: Payer: Self-pay | Admitting: Internal Medicine

## 2012-07-28 ENCOUNTER — Ambulatory Visit (INDEPENDENT_AMBULATORY_CARE_PROVIDER_SITE_OTHER): Payer: BC Managed Care – PPO | Admitting: Internal Medicine

## 2012-07-28 VITALS — BP 104/68 | HR 60 | Wt 151.8 lb

## 2012-07-28 DIAGNOSIS — R7309 Other abnormal glucose: Secondary | ICD-10-CM

## 2012-07-28 DIAGNOSIS — E559 Vitamin D deficiency, unspecified: Secondary | ICD-10-CM | POA: Insufficient documentation

## 2012-07-28 DIAGNOSIS — I7103 Dissection of thoracoabdominal aorta: Secondary | ICD-10-CM

## 2012-07-28 DIAGNOSIS — I10 Essential (primary) hypertension: Secondary | ICD-10-CM

## 2012-07-28 DIAGNOSIS — E785 Hyperlipidemia, unspecified: Secondary | ICD-10-CM

## 2012-07-28 DIAGNOSIS — G459 Transient cerebral ischemic attack, unspecified: Secondary | ICD-10-CM

## 2012-07-28 MED ORDER — LABETALOL HCL 200 MG PO TABS: ORAL_TABLET | ORAL | Status: DC

## 2012-07-28 NOTE — Patient Instructions (Addendum)
Please  schedule fasting Labs : BMET,Lipids, hepatic panel, TSH, vit D level. PLEASE BRING THESE INSTRUCTIONS TO FOLLOW UP  LAB APPOINTMENT.This will guarantee correct labs are drawn, eliminating need for repeat blood sampling ( needle sticks ! ). Diagnoses /Codes: 401.9, 268.9,272.4  If you activate My Chart; the results can be released to you as soon as they populate from the lab. If you choose not to use this program; the labs have to be reviewed, copied & mailed  causing a delay in getting the results to you.

## 2012-07-28 NOTE — Assessment & Plan Note (Signed)
Her CT monitor is up-to-date. A copy of the record and labs, when available, will be provided for her to share with Dr Everardo Beals

## 2012-07-28 NOTE — Assessment & Plan Note (Addendum)
Blood pressure is at goal as recommended by her surgeon. Renal function should be checked

## 2012-07-28 NOTE — Assessment & Plan Note (Addendum)
She describes leg cramps; chemistries and vitamin D level should be checked. 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 By history this is overdue for followup; she did have some mild osteopenia  & describes some loss of height

## 2012-07-28 NOTE — Progress Notes (Signed)
  Subjective:    Patient ID: Patricia Diaz, female    DOB: December 24, 1955, 56 y.o.   MRN: [id number]  HPI HYPERTENSION: Disease Monitoring: Blood pressure range- 90s-108/50-60s Chest pain, palpitations- no       Dyspnea- no Medications: Compliance- yes  Lightheadedness,Syncope- no    Edema- no  FASTING HYPERGLYCEMIA, PMH of:  Disease Monitoring: Blood Sugar ranges-no  Polyuria/phagia/dipsia- no      Visual problems- no  HYPERLIPIDEMIA: Disease Monitoring: See symptoms for Hypertension Diet: no specific diet Medications: Compliance- no lipids since 2009  Abd pain, bowel changes- no  Muscle aches- leg cramps; PMH of vitamin D def  Past medical history/family history/social history were all reviewed and updated.          Review of Systems She is overdue to schedule followup with Dr Everardo Beals, her Cardiovascular Thoracic Surgeon at [facility] to monitor  the aortic aneurysm.  Migraines are well controlled with Topamax prophylaxis.     Objective:   Physical Exam Gen.: Healthy and well-nourished in appearance. Alert, appropriate and cooperative throughout exam. Head: Normocephalic without obvious abnormalities  Eyes: No corneal or conjunctival inflammation noted.  Fundal exam is benign without hemorrhages, exudate, papilledema. Extraocular motion intact. Vision grossly normal. Nose: External nasal exam reveals no deformity or inflammation. Nasal mucosa are pink and moist. No lesions or exudates noted.   Mouth: Oral mucosa and oropharynx reveal no lesions or exudates. Teeth in good repair. Neck: No deformities, masses, or tenderness noted. Range of motion & Thyroid normal. Lungs: Normal respiratory effort; chest expands symmetrically. Lungs are clear to auscultation without rales, wheezes, or increased work of breathing. Heart: Normal rate and rhythm. Normal S1 and S2. No gallop, click, or rub. S4 with slight slurring; no murmur. Abdomen: Bowel sounds normal; abdomen soft and  nontender. No masses, organomegaly or hernias noted.Aorta not palpable                                                   Musculoskeletal/extremities: No deformity or scoliosis noted of  the thoracic or lumbar spine.Op scar LS area. No clubbing, cyanosis, edema, or deformity noted. Tone & strength  normal.Joints normal. Nail health  good. Vascular: Carotid, radial artery, dorsalis pedis and  posterior tibial pulses are full and equal. No bruits present. Neurologic: Alert and oriented x3. Deep tendon reflexes symmetrical and normal.          Skin: Intact without suspicious lesions or rashes. Lymph: No cervical, axillary lymphadenopathy present. Psych: Mood and affect are normal. Normally interactive                                                                                         Assessment & Plan:

## 2012-07-28 NOTE — Assessment & Plan Note (Signed)
Her last lipids were 2009; HDL was below: 50; LDL was minimally elevated at 127. There is no premature coronary disease in family. Fasting lipids indicated

## 2012-08-01 ENCOUNTER — Other Ambulatory Visit: Payer: BC Managed Care – PPO

## 2012-08-11 ENCOUNTER — Encounter: Payer: Self-pay | Admitting: Gastroenterology

## 2012-08-25 ENCOUNTER — Other Ambulatory Visit: Payer: Self-pay | Admitting: Internal Medicine

## 2012-09-12 ENCOUNTER — Other Ambulatory Visit: Payer: BC Managed Care – PPO

## 2012-09-19 ENCOUNTER — Ambulatory Visit (HOSPITAL_BASED_OUTPATIENT_CLINIC_OR_DEPARTMENT_OTHER)
Admission: RE | Admit: 2012-09-19 | Discharge: 2012-09-19 | Disposition: A | Discharge status: Home / Self Care | Payer: BC Managed Care – PPO | Source: Ambulatory Visit | Attending: Internal Medicine | Admitting: Internal Medicine

## 2012-09-19 ENCOUNTER — Ambulatory Visit (INDEPENDENT_AMBULATORY_CARE_PROVIDER_SITE_OTHER): Payer: BC Managed Care – PPO | Admitting: Internal Medicine

## 2012-09-19 ENCOUNTER — Other Ambulatory Visit: Payer: BC Managed Care – PPO

## 2012-09-19 VITALS — BP 102/70 | HR 69 | Temp 97.9°F | Wt 148.0 lb

## 2012-09-19 DIAGNOSIS — W19XXXA Unspecified fall, initial encounter: Secondary | ICD-10-CM | POA: Insufficient documentation

## 2012-09-19 DIAGNOSIS — S59909A Unspecified injury of unspecified elbow, initial encounter: Secondary | ICD-10-CM | POA: Insufficient documentation

## 2012-09-19 DIAGNOSIS — S6990XA Unspecified injury of unspecified wrist, hand and finger(s), initial encounter: Secondary | ICD-10-CM | POA: Insufficient documentation

## 2012-09-19 DIAGNOSIS — I10 Essential (primary) hypertension: Secondary | ICD-10-CM

## 2012-09-19 DIAGNOSIS — S299XXA Unspecified injury of thorax, initial encounter: Secondary | ICD-10-CM

## 2012-09-19 DIAGNOSIS — S59902A Unspecified injury of left elbow, initial encounter: Secondary | ICD-10-CM

## 2012-09-19 DIAGNOSIS — E559 Vitamin D deficiency, unspecified: Secondary | ICD-10-CM

## 2012-09-19 DIAGNOSIS — S2239XA Fracture of one rib, unspecified side, initial encounter for closed fracture: Secondary | ICD-10-CM | POA: Insufficient documentation

## 2012-09-19 DIAGNOSIS — E785 Hyperlipidemia, unspecified: Secondary | ICD-10-CM

## 2012-09-19 DIAGNOSIS — S2341XA Sprain of ribs, initial encounter: Secondary | ICD-10-CM

## 2012-09-19 LAB — HEPATIC FUNCTION PANEL
ALT: 19 U/L (ref 0–35)
AST: 19 U/L (ref 0–37)
Alkaline Phosphatase: 60 U/L (ref 39–117)
Bilirubin, Direct: 0.1 mg/dL (ref 0.0–0.3)
Total Bilirubin: 1.3 mg/dL — ABNORMAL HIGH (ref 0.3–1.2)

## 2012-09-19 LAB — BASIC METABOLIC PANEL
BUN: 15 mg/dL (ref 6–23)
CO2: 27 mEq/L (ref 19–32)
Calcium: 9.2 mg/dL (ref 8.4–10.5)
Creatinine, Ser: 0.8 mg/dL (ref 0.4–1.2)
Glucose, Bld: 122 mg/dL — ABNORMAL HIGH (ref 70–99)

## 2012-09-19 LAB — LIPID PANEL: HDL: 59.5 mg/dL (ref >39.00)

## 2012-09-19 LAB — LDL CHOLESTEROL, DIRECT: Direct LDL: 149.1 mg/dL

## 2012-09-19 MED ORDER — OXYCODONE-ACETAMINOPHEN 10-325 MG PO TABS: 1.0000 | ORAL_TABLET | Freq: Four times a day (QID) | ORAL | Status: DC | PRN

## 2012-09-19 NOTE — Progress Notes (Signed)
  Subjective:    Patient ID: Patricia Diaz, female    DOB: 1955/11/07, 56 y.o.   MRN: [id number]  HPI  She tripped descending the stairs 09/17/12, tumbling the full 15 steps. She struck her right temple, right rib cage, and left elbow with residual pain and/or swelling. She may have had loss of consciousness. She now has pain with deep inspiration. The left elbow is swollen and painful. She has residual hematoma over the right temple.  She's been treating this with aspirin; she's remained at home in bed    Review of Systems She denies any secondary loss of consciousness or significant headache since the event. She is not having shortness of breath,abdominal pain, hematuria, or melena.     Objective:   Physical Exam General appearance:good health ;well nourished; no acute distress or increased work of breathing but uncomfortable.  No  lymphadenopathy about the head, neck, or axilla noted.   Eyes: No conjunctival inflammation or lid edema is present. Extraocular motion is intact. Field of vision is normal. Minimal ptosis OD  Ears:  External ear exam shows no significant lesions or deformities.  Otoscopic examination reveals clear canals, tympanic membranes are intact bilaterally without bulging, retraction, inflammation or discharge.  Nose:  External nasal examination shows no deformity or inflammation. Nasal mucosa are pink and moist without lesions or exudates. No septal dislocation or deviation.No obstruction to airflow.   Oral exam: Dental hygiene is good; lips and gums are healthy appearing.There is no oropharyngeal erythema or exudate noted. 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   Supple with full range of motion without pain.   Heart:  Normal rate and regular rhythm. S1 and S2 normal without gallop, murmur, click, rub or other extra sounds.   Lungs:No wheezes, rhonchi ,or rales  present.there is a slight rub  suggested of the right inferior thorax midaxillary line. No increased work of breathing. Tenderness to palpation of the right thorax. She is splinting on the right. Breath sounds are decreased.   Extremities:  No cyanosis, edema, or clubbing  noted . There is significant ecchymosis and swelling of the left elbow with decreased range of motion and pain with flexion  Skin: Warm & dry . Hematoma right lateral supraorbital/temple area         Assessment & Plan:  #1 inadvertent fall with musculoskeletal trauma. No neuromuscular deficit present.  Plan: Films of the left elbow and ribs.

## 2012-09-19 NOTE — Addendum Note (Signed)
Addended by: Silvio Pate D on: 09/19/2012 01:45 PM   Modules accepted: Orders

## 2012-09-19 NOTE — Patient Instructions (Addendum)
Use warm moist compresses to 3 times a day to the affected area.   If you activate My Chart; the results can be released to you as soon as they populate from the lab. If you choose not to use this program; the labs have to be reviewed, copied & mailed   causing a delay in getting the results to you.  Please remain out of work until  09/22/2012  09/20/12 7:22 AM: minimally  displaced olecranon fracture; minimally displaced seventh right rib fracture. Labs reveal hypokalemia. HCTZ will be changed to spironolactone 25 mg daily. LDL is now 149 and glucose 122. A1 C will be checked along with NMR lipoprotein profile.  8:04 AM: results called to her. She will contact the orthopedist on her own. I told to call she does need a referral. She is to schedule  fasting labs as noted above.

## 2012-09-20 ENCOUNTER — Encounter: Payer: Self-pay | Admitting: Internal Medicine

## 2012-09-20 MED ORDER — SPIRONOLACTONE 25 MG PO TABS: 25.0000 mg | ORAL_TABLET | Freq: Every day | ORAL | Status: DC

## 2012-09-20 NOTE — Addendum Note (Signed)
Addended byPecola Lawless on: 09/20/2012 08:06 AM   Modules accepted: Orders

## 2012-09-24 LAB — VITAMIN D 1,25 DIHYDROXY: Vitamin D 1, 25 (OH)2 Total: 51 pg/mL (ref 18–72)

## 2012-10-02 ENCOUNTER — Telehealth: Payer: Self-pay | Admitting: Internal Medicine

## 2012-10-02 NOTE — Telephone Encounter (Signed)
Dr.Hopper please advise 

## 2012-10-02 NOTE — Telephone Encounter (Signed)
OK but she needs to add  20  milliequivalents potassium chloride daily; recheck BMET in 3-4 weeks ( 276.8)

## 2012-10-02 NOTE — Telephone Encounter (Signed)
Patient states the spironolactone made her dizzy and she has switched back to HCTZ. She would like to know if this is ok or if there is something else she can take.

## 2012-10-02 NOTE — Telephone Encounter (Signed)
Left message to call office

## 2012-10-03 ENCOUNTER — Other Ambulatory Visit: Payer: Self-pay | Admitting: *Deleted

## 2012-10-03 DIAGNOSIS — I10 Essential (primary) hypertension: Secondary | ICD-10-CM

## 2012-10-03 MED ORDER — HYDROCHLOROTHIAZIDE 12.5 MG PO CAPS: 12.5000 mg | ORAL_CAPSULE | Freq: Every day | ORAL | Status: DC

## 2012-10-03 MED ORDER — POTASSIUM CHLORIDE CRYS ER 20 MEQ PO TBCR: 20.0000 meq | EXTENDED_RELEASE_TABLET | Freq: Every day | ORAL | Status: DC

## 2012-10-03 NOTE — Telephone Encounter (Signed)
Medication sent to pharmacy and patient notified. 

## 2012-10-10 ENCOUNTER — Other Ambulatory Visit: Payer: BC Managed Care – PPO

## 2012-10-17 ENCOUNTER — Other Ambulatory Visit: Payer: BC Managed Care – PPO

## 2012-10-29 HISTORY — PX: COLONOSCOPY: SHX174

## 2012-11-15 ENCOUNTER — Encounter: Payer: Self-pay | Admitting: Internal Medicine

## 2012-11-15 ENCOUNTER — Ambulatory Visit (INDEPENDENT_AMBULATORY_CARE_PROVIDER_SITE_OTHER): Payer: BC Managed Care – PPO | Admitting: Internal Medicine

## 2012-11-15 VITALS — BP 100/62 | HR 71 | Temp 97.5°F | Wt 150.0 lb

## 2012-11-15 DIAGNOSIS — J40 Bronchitis, not specified as acute or chronic: Secondary | ICD-10-CM

## 2012-11-15 MED ORDER — AZITHROMYCIN 250 MG PO TABS: ORAL_TABLET | ORAL | Status: DC

## 2012-11-15 MED ORDER — PROMETHAZINE-CODEINE 6.25-10 MG/5ML PO SYRP: 5.0000 mL | ORAL_SOLUTION | ORAL | Status: DC | PRN

## 2012-11-15 NOTE — Patient Instructions (Addendum)
Use over-the-counter  "cold" medicines  such as  "Afrin" nasal spray for nasal congestion as directed instead. Use" Delsym" or" Robitussin" cough syrup varietis for cough.  You can use plain "Tylenol" or "Advil" for fever, chills and achyness.   "Common cold" symptoms are usually triggered by a virus.  The antibiotics are usually not necessary. On average, a" viral cold" illness would take 4-7 days to resolve.

## 2012-11-15 NOTE — Progress Notes (Signed)
  Subjective:    Patient ID: Patricia Diaz, female    DOB: 1956-04-23, 57 y.o.   MRN: [id number]  HPI  C/o cough and congesion x 3-4 d C/o ST - bad She had a flu shot   Review of Systems  Constitutional: Negative for chills, activity change, appetite change, fatigue and unexpected weight change.  HENT: Positive for sinus pressure. Negative for congestion and mouth sores.   Eyes: Negative for visual disturbance.  Respiratory: Positive for cough. Negative for chest tightness.   Gastrointestinal: Negative for nausea and abdominal pain.  Genitourinary: Negative for frequency, difficulty urinating and vaginal pain.  Musculoskeletal: Negative for back pain and gait problem.  Skin: Negative for pallor and rash.  Neurological: Negative for dizziness, tremors, weakness, numbness and headaches.  Psychiatric/Behavioral: Negative for suicidal ideas, confusion and sleep disturbance. The patient is not nervous/anxious.        Objective:   Physical Exam  Constitutional: She appears well-developed. No distress.  HENT:  Head: Normocephalic.  Right Ear: External ear normal.  Left Ear: External ear normal.  Nose: Nose normal.  Mouth/Throat: Oropharynx is clear and moist.       eryth throat  Eyes: Conjunctivae normal are normal. Pupils are equal, round, and reactive to light. Right eye exhibits no discharge. Left eye exhibits no discharge.  Neck: Normal range of motion. Neck supple. No JVD present. No tracheal deviation present. No thyromegaly present.  Cardiovascular: Normal rate, regular rhythm and normal heart sounds.   Pulmonary/Chest: No stridor. No respiratory distress. She has no wheezes.  Abdominal: Soft. Bowel sounds are normal. She exhibits no distension and no mass. There is no tenderness. There is no rebound and no guarding.  Musculoskeletal: She exhibits no edema and no tenderness.  Lymphadenopathy:    She has no cervical adenopathy.  Neurological: She displays normal reflexes.  No cranial nerve deficit. She exhibits normal muscle tone. Coordination normal.  Skin: No rash noted. No erythema.  Psychiatric: She has a normal mood and affect. Her behavior is normal. Judgment and thought content normal.          Assessment & Plan:

## 2012-11-15 NOTE — Assessment & Plan Note (Signed)
1/14 likely viral Prom-cod cough syr Zpac if worse

## 2012-11-21 ENCOUNTER — Other Ambulatory Visit (INDEPENDENT_AMBULATORY_CARE_PROVIDER_SITE_OTHER): Payer: BC Managed Care – PPO

## 2012-11-21 DIAGNOSIS — E785 Hyperlipidemia, unspecified: Secondary | ICD-10-CM

## 2012-11-21 DIAGNOSIS — R7309 Other abnormal glucose: Secondary | ICD-10-CM

## 2012-11-21 DIAGNOSIS — E876 Hypokalemia: Secondary | ICD-10-CM

## 2012-11-22 LAB — HEPATITIS B SURFACE ANTIBODY,QUALITATIVE: Hep B S Ab: REACTIVE — AB

## 2012-11-22 LAB — NMR, LIPOPROFILE

## 2012-12-05 ENCOUNTER — Encounter: Payer: Self-pay | Admitting: Internal Medicine

## 2012-12-26 ENCOUNTER — Encounter: Payer: Self-pay | Admitting: Internal Medicine

## 2013-01-09 ENCOUNTER — Other Ambulatory Visit: Payer: Self-pay | Admitting: Internal Medicine

## 2013-02-24 ENCOUNTER — Telehealth: Payer: Self-pay | Admitting: Internal Medicine

## 2013-02-24 NOTE — Telephone Encounter (Signed)
Noted, per MD protocol if patient sent to the ER or scheduled appointment, ok to close encounter

## 2013-02-24 NOTE — Telephone Encounter (Signed)
Patient Information:  Caller Name: Avilene  Phone: [phone]  Patient: Patricia Diaz, Patricia Diaz  Gender: Female  DOB: 1956/02/13  Age: 58 Years  PCP: Marga Melnick  Office Follow Up:  Does the office need to follow up with this patient?: No  Instructions For The Office: N/A  RN Note:  Per disposition Got To ED, contacted office and spoke with [facility] and advised pt need to to to ED for Xray.  Symptoms  Reason For Call & Symptoms: Pt states " I am pretty sure I broke my rib."  Reviewed Health History In EMR: Yes  Reviewed Medications In EMR: Yes  Reviewed Allergies In EMR: Yes  Reviewed Surgeries / Procedures: Yes  Date of Onset of Symptoms: 02/23/2013  Guideline(s) Used:  Chest Injury  Disposition Per Guideline:   Go to ED Now (or to Office with PCP Approval)  Reason For Disposition Reached:   Can't take a deep breath but no respiratory distress (e.g., hurts to take a deep breath)  Advice Given:  Call Back If:  You become worse.  Patient Will Follow Care Advice:  YES

## 2013-02-26 ENCOUNTER — Other Ambulatory Visit: Payer: Self-pay | Admitting: Internal Medicine

## 2013-02-26 ENCOUNTER — Encounter: Payer: Self-pay | Admitting: Lab

## 2013-02-27 ENCOUNTER — Ambulatory Visit: Payer: BC Managed Care – PPO | Admitting: Internal Medicine

## 2013-03-02 ENCOUNTER — Encounter: Payer: Self-pay | Admitting: Gastroenterology

## 2013-03-05 ENCOUNTER — Encounter: Payer: Self-pay | Admitting: Lab

## 2013-03-06 ENCOUNTER — Ambulatory Visit: Payer: BC Managed Care – PPO | Admitting: Internal Medicine

## 2013-03-10 ENCOUNTER — Encounter: Payer: Self-pay | Admitting: Lab

## 2013-03-11 ENCOUNTER — Ambulatory Visit: Payer: BC Managed Care – PPO | Admitting: Internal Medicine

## 2013-03-27 ENCOUNTER — Ambulatory Visit: Payer: BC Managed Care – PPO | Admitting: Internal Medicine

## 2013-03-28 ENCOUNTER — Other Ambulatory Visit: Payer: Self-pay | Admitting: Internal Medicine

## 2013-04-07 ENCOUNTER — Encounter: Payer: Self-pay | Admitting: Internal Medicine

## 2013-04-07 ENCOUNTER — Ambulatory Visit (INDEPENDENT_AMBULATORY_CARE_PROVIDER_SITE_OTHER): Payer: BC Managed Care – PPO | Admitting: Internal Medicine

## 2013-04-07 VITALS — BP 110/68 | HR 68 | Wt 149.0 lb

## 2013-04-07 DIAGNOSIS — E876 Hypokalemia: Secondary | ICD-10-CM | POA: Insufficient documentation

## 2013-04-07 DIAGNOSIS — Z87442 Personal history of urinary calculi: Secondary | ICD-10-CM

## 2013-04-07 DIAGNOSIS — R7309 Other abnormal glucose: Secondary | ICD-10-CM

## 2013-04-07 DIAGNOSIS — E785 Hyperlipidemia, unspecified: Secondary | ICD-10-CM

## 2013-04-07 DIAGNOSIS — I10 Essential (primary) hypertension: Secondary | ICD-10-CM

## 2013-04-07 MED ORDER — HYDROCHLOROTHIAZIDE 12.5 MG PO CAPS: ORAL_CAPSULE | ORAL | Status: DC

## 2013-04-07 MED ORDER — ATORVASTATIN CALCIUM 20 MG PO TABS: 20.0000 mg | ORAL_TABLET | Freq: Every day | ORAL | Status: DC

## 2013-04-07 NOTE — Assessment & Plan Note (Signed)
No diabetic risk component is present. Monitoring of the A1c is recommended annually

## 2013-04-07 NOTE — Progress Notes (Signed)
  Subjective:    Patient ID: Patricia Diaz, female    DOB: 1956/05/08, 57 y.o.   MRN: [id number]  HPI She is here to followup her NMR Lipoproprofile,potassium and A1c. On low-dose HCTZ 12.5 mg her potassium was 3.1 in November 2013. She was switched over to spironolactone; but she had dizziness and restarted the HCTZ. 20 mEq of potassium was added each day; her potassium rose to 3.8. The goal is to keep her blood pressure is far below 135/85 as possible because of the history of aneurysm.  In November 2013 her fasting blood sugar was 122; A1c is in the nondiabetic range at its 5.7 in January of this year.     Review of Systems She is on a modified heart healthy diet; she is not exercising . She denies chest pain, palpitations, dyspnea, or claudication. Family history is positive for premature coronary disease .Her father had MI @ 53, 3 P uncles  & PGF MI  In their 68s. Advanced cholesterol testing reveals her LDL goal is less than 100, ideally < 70. She is not on a statin.  .  In May of this year she underwent cystoscopy and stent placement for nephrolithiasis. She is not on a calcium supplement. She does have a history of vitamin D deficiency with levels as low as 22. She will return to Dr. Logan Bores to discuss the composition of the kidney stone. She is presently on Flomax following the stent placement and retrieval of the stone.     Objective:   Physical Exam General appearance :thin & in good health and nourishment w/o distress.  Eyes: No conjunctival inflammation or scleral icterus is present.  Oral exam: Dental hygiene is good; lips and gums are healthy appearing.There is no oropharyngeal erythema or exudate noted.   Heart:  Normal rate and regular rhythm. S1 and S2 normal without gallop, murmur, click, rub or other extra sounds     Lungs:Chest clear to auscultation; no wheezes, rhonchi,rales ,or rubs present.No increased work of breathing.   Abdomen: bowel sounds normal, soft and  non-tender without masses, organomegaly or hernias noted.  No guarding or rebound   Skin:Warm & dry.  Intact without suspicious lesions or rashes ; no jaundice or tenting  Lymphatic: No lymphadenopathy is noted about the head, neck, axilla.             Assessment & Plan:  See Current Assessment & Plan in Problem List under specific Diagnosis

## 2013-04-07 NOTE — Patient Instructions (Addendum)
Please  schedule fasting Labs in 10-11 weeks after Atorvastatin started: CK,Lipids, hepatic panel. PLEASE BRING THESE INSTRUCTIONS TO FOLLOW UP  LAB APPOINTMENT.This will guarantee correct labs are drawn, eliminating need for repeat blood sampling ( needle sticks ! ). Diagnoses /Codes: 272.4,995.20.  If you activate the  My Chart system; lab & Xray results will be released directly  to you as soon as I review & address these through the computer. If you choose not to sign up for My Chart within 36 hours of labs being drawn; results will be reviewed & interpretation added before being copied & mailed, causing a delay in getting the results to you.If you do not receive that report within 7-10 days ,please call. Additionally you can use this system to gain direct  access to your records  if  out of town or @ an office of a  physician who is not in  the My Chart network.  This improves continuity of care & places you in control of your medical record.  Share results with all non [facility] medical staff seen

## 2013-04-07 NOTE — Assessment & Plan Note (Signed)
Blood pressure goal is an average is far below 135/85 as she can tolerate without symptoms of hypotension.

## 2013-04-07 NOTE — Assessment & Plan Note (Signed)
Generic atorvastatin 20 mg daily recommended with fasting labs after 10-11 weeks.

## 2013-04-07 NOTE — Assessment & Plan Note (Signed)
The HCTZ and potassium supplement will be continued as she is intolerant to spironolactone unless Dr. Logan Bores wants to change medications based on the composition of the kidney stone

## 2013-04-09 ENCOUNTER — Encounter: Payer: Self-pay | Admitting: Gastroenterology

## 2013-04-10 ENCOUNTER — Ambulatory Visit (AMBULATORY_SURGERY_CENTER): Payer: BC Managed Care – PPO | Admitting: *Deleted

## 2013-04-10 VITALS — Ht 66.0 in | Wt 147.6 lb

## 2013-04-10 DIAGNOSIS — Z1211 Encounter for screening for malignant neoplasm of colon: Secondary | ICD-10-CM

## 2013-04-10 MED ORDER — MOVIPREP 100 G PO SOLR: ORAL | Status: DC

## 2013-04-13 ENCOUNTER — Encounter: Payer: Self-pay | Admitting: Gastroenterology

## 2013-04-22 ENCOUNTER — Ambulatory Visit (AMBULATORY_SURGERY_CENTER): Payer: BC Managed Care – PPO | Admitting: Gastroenterology

## 2013-04-22 ENCOUNTER — Encounter: Payer: Self-pay | Admitting: Gastroenterology

## 2013-04-22 VITALS — BP 116/77 | HR 58 | Temp 97.7°F | Resp 20 | Ht 66.0 in | Wt 147.0 lb

## 2013-04-22 DIAGNOSIS — Z1211 Encounter for screening for malignant neoplasm of colon: Secondary | ICD-10-CM

## 2013-04-22 DIAGNOSIS — Z86010 Personal history of colonic polyps: Secondary | ICD-10-CM

## 2013-04-22 DIAGNOSIS — K5730 Diverticulosis of large intestine without perforation or abscess without bleeding: Secondary | ICD-10-CM

## 2013-04-22 DIAGNOSIS — K5900 Constipation, unspecified: Secondary | ICD-10-CM

## 2013-04-22 MED ORDER — SODIUM CHLORIDE 0.9 % IV SOLN: 500.0000 mL | INTRAVENOUS | Status: DC

## 2013-04-22 NOTE — Progress Notes (Signed)
Patient did not experience any of the following events: a burn prior to discharge; a fall within the facility; wrong site/side/patient/procedure/implant event; or a hospital transfer or hospital admission upon discharge from the facility. (G8907) Patient did not have preoperative order for IV antibiotic SSI prophylaxis. (G8918)  

## 2013-04-22 NOTE — Patient Instructions (Addendum)
YOU HAD AN ENDOSCOPIC PROCEDURE TODAY AT THE [facility]: Refer to the procedure report that was given to you for any specific questions about what was found during the examination.  If the procedure report does not answer your questions, please call your gastroenterologist to clarify.  If you requested that your care partner not be given the details of your procedure findings, then the procedure report has been included in a sealed envelope for you to review at your convenience later.  YOU SHOULD EXPECT: Some feelings of bloating in the abdomen. Passage of more gas than usual.  Walking can help get rid of the air that was put into your GI tract during the procedure and reduce the bloating. If you had a lower endoscopy (such as a colonoscopy or flexible sigmoidoscopy) you may notice spotting of blood in your stool or on the toilet paper. If you underwent a bowel prep for your procedure, then you may not have a normal bowel movement for a few days.  DIET: Your first meal following the procedure should be a light meal and then it is ok to progress to your normal diet.  A half-sandwich or bowl of soup is an example of a good first meal.  Heavy or fried foods are harder to digest and may make you feel nauseous or bloated.  Likewise meals heavy in dairy and vegetables can cause extra gas to form and this can also increase the bloating.  Drink plenty of fluids but you should avoid alcoholic beverages for 24 hours.  ACTIVITY: Your care partner should take you home directly after the procedure.  You should plan to take it easy, moving slowly for the rest of the day.  You can resume normal activity the day after the procedure however you should NOT DRIVE or use heavy machinery for 24 hours (because of the sedation medicines used during the test).    SYMPTOMS TO REPORT IMMEDIATELY: A gastroenterologist can be reached at any hour.  During normal business hours, 8:30 AM to 5:00 PM Monday through Friday,  call [phone].  After hours and on weekends, please call the GI answering service at [phone] who will take a message and have the physician on call contact you.   Following lower endoscopy (colonoscopy or flexible sigmoidoscopy):  Excessive amounts of blood in the stool  Significant tenderness or worsening of abdominal pains  Swelling of the abdomen that is new, acute  Fever of 100F or higher    FOLLOW UP: If any biopsies were taken you will be contacted by phone or by letter within the next 1-3 weeks.  Call your gastroenterologist if you have not heard about the biopsies in 3 weeks.  Our staff will call the home number listed on your records the next business day following your procedure to check on you and address any questions or concerns that you may have at that time regarding the information given to you following your procedure. This is a courtesy call and so if there is no answer at the home number and we have not heard from you through the emergency physician on call, we will assume that you have returned to your regular daily activities without incident.  SIGNATURES/CONFIDENTIALITY: You and/or your care partner have signed paperwork which will be entered into your electronic medical record.  These signatures attest to the fact that that the information above on your After Visit Summary has been reviewed and is understood.  Full responsibility of the confidentiality   of this discharge information lies with you and/or your care-partner.    Linzess samples given to pt by Dr. Alfredia Ferguson fiber diet information given to pt as well as information on Benefiber

## 2013-04-22 NOTE — Progress Notes (Signed)
Report to pacu rn, vss, bbs=clear 

## 2013-04-22 NOTE — Op Note (Signed)
[facility] [address] [place], [address]   COLONOSCOPY PROCEDURE REPORT  PATIENT: Patricia Diaz, Patricia Diaz  MR#: [id number] BIRTHDATE: [birth year] , 57  yrs. old GENDER: Female ENDOSCOPIST: Mardella Layman, MD, [facility] REFERRED BY: PROCEDURE DATE:  04/22/2013 PROCEDURE:   Colonoscopy, screening ASA CLASS:   Class III INDICATIONS:Patient's personal history of adenomatous colon polyps.  MEDICATIONS: Propofol (Diprivan) 220 mg IV  DESCRIPTION OF PROCEDURE:   After the risks and benefits and of the procedure were explained, informed consent was obtained.  A digital rectal exam revealed no abnormalities of the rectum.    The LB [id number]  endoscope was introduced through the anus and advanced to the cecum, which was identified by both the appendix and ileocecal valve .  The quality of the prep was excellent, using MoviPrep .  The instrument was then slowly withdrawn as the colon was fully examined.     COLON FINDINGS: A normal appearing cecum, ileocecal valve, and appendiceal orifice were identified.  The ascending, hepatic flexure, transverse, splenic flexure, descending, sigmoid colon and rectum appeared unremarkable.  No polyps or cancers were seen.Scattered diverticulae in left and right colon noted, Retroflexed views revealed no abnormalities.     The scope was then withdrawn from the patient and the procedure completed.  COMPLICATIONS: There were no complications. ENDOSCOPIC IMPRESSION: Normal colon ...or polyps noted with excellent prep.  There are scattered small diverticula throughout the length of the colon. This patient has chronic functional constipation, and we will try Linzess 145 mcg a day as a therapeutic trial.  She needs followup colonoscopy per standard protocol in 10 years.  Also recommend a high fiber diet, daily Benefiber and liberal by mouth fluids.  RECOMMENDATIONS: 1.  Continue current medications 2.  High fiber diet with  liberal fluid intake. 3.  Continue current colorectal screening recommendations for "routine risk" patients with a repeat colonoscopy in 10 years.   REPEAT EXAM:  cc:  _______________________________ eSignedMardella Layman, MD, [facility] 04/22/2013 11:30 AM

## 2013-04-23 ENCOUNTER — Telehealth: Payer: Self-pay | Admitting: *Deleted

## 2013-04-23 ENCOUNTER — Telehealth: Payer: Self-pay | Admitting: Gastroenterology

## 2013-04-23 MED ORDER — LINACLOTIDE 145 MCG PO CAPS: 145.0000 ug | ORAL_CAPSULE | Freq: Every day | ORAL | Status: DC

## 2013-04-23 NOTE — Telephone Encounter (Signed)
  Follow up Call-  Call back number 04/22/2013  Post procedure Call Back phone  # [phone]  Permission to leave phone message Yes     Patient questions:  Do you have a fever, pain , or abdominal swelling? no Pain Score  0 *  Have you tolerated food without any problems? yes  Have you been able to return to your normal activities? yes  Do you have any questions about your discharge instructions: Diet   no Medications  no Follow up visit  no  Do you have questions or concerns about your Care? no  Actions: * If pain score is 4 or above: No action needed, pain <4.

## 2013-04-23 NOTE — Telephone Encounter (Signed)
Rx sent 

## 2013-05-27 ENCOUNTER — Telehealth: Payer: Self-pay | Admitting: *Deleted

## 2013-05-27 NOTE — Telephone Encounter (Signed)
Received fax from [facility] that patient needs prior auth done for Linzess I called [phone] I spoke with Fulton Mole and per Daune Perch was approved until 11-27-2013 PA: [id number] Pharmacy notified

## 2013-07-12 ENCOUNTER — Other Ambulatory Visit: Payer: Self-pay | Admitting: Internal Medicine

## 2013-07-13 NOTE — Telephone Encounter (Signed)
Med filled.  

## 2013-09-25 ENCOUNTER — Other Ambulatory Visit: Payer: Self-pay | Admitting: Internal Medicine

## 2013-09-25 NOTE — Telephone Encounter (Signed)
Clonidine refilled per protocol

## 2013-10-17 ENCOUNTER — Other Ambulatory Visit: Payer: Self-pay | Admitting: Internal Medicine

## 2013-10-20 NOTE — Telephone Encounter (Signed)
Labetalol refilled per protocol. JG//CMA 

## 2013-11-02 ENCOUNTER — Other Ambulatory Visit (INDEPENDENT_AMBULATORY_CARE_PROVIDER_SITE_OTHER): Payer: Self-pay | Admitting: General Surgery

## 2013-11-02 ENCOUNTER — Ambulatory Visit (INDEPENDENT_AMBULATORY_CARE_PROVIDER_SITE_OTHER): Payer: 59 | Admitting: General Surgery

## 2013-11-02 ENCOUNTER — Encounter (INDEPENDENT_AMBULATORY_CARE_PROVIDER_SITE_OTHER): Payer: Self-pay | Admitting: General Surgery

## 2013-11-02 ENCOUNTER — Encounter (INDEPENDENT_AMBULATORY_CARE_PROVIDER_SITE_OTHER): Payer: Self-pay

## 2013-11-02 VITALS — BP 130/86 | HR 72 | Temp 98.0°F | Resp 14 | Ht 66.0 in | Wt 152.6 lb

## 2013-11-02 DIAGNOSIS — S21201A Unspecified open wound of right back wall of thorax without penetration into thoracic cavity, initial encounter: Secondary | ICD-10-CM

## 2013-11-02 DIAGNOSIS — L02219 Cutaneous abscess of trunk, unspecified: Secondary | ICD-10-CM

## 2013-11-02 DIAGNOSIS — L02212 Cutaneous abscess of back [any part, except buttock]: Secondary | ICD-10-CM

## 2013-11-02 DIAGNOSIS — L03319 Cellulitis of trunk, unspecified: Secondary | ICD-10-CM

## 2013-11-02 MED ORDER — OXYCODONE HCL 5 MG PO TABS: 5.0000 mg | ORAL_TABLET | Freq: Four times a day (QID) | ORAL | Status: DC | PRN

## 2013-11-02 MED ORDER — DOXYCYCLINE HYCLATE 100 MG PO CAPS: 100.0000 mg | ORAL_CAPSULE | Freq: Two times a day (BID) | ORAL | Status: DC

## 2013-11-02 NOTE — Patient Instructions (Signed)
Change after gauze as needed, drainage is expected

## 2013-11-02 NOTE — Progress Notes (Signed)
Subjective:     Patient ID: Patricia Diaz, female   DOB: [birth year], 58 y.o.   MRN: [id number]  HPI Patient developed pain and redness on her right lower back 5 days ago. This gradually worsened. She has not drained any fluid. She urgent care to have it evaluated yesterday but they were closed. She presented to urgent clinic today at the request of Dr. Linna Darner.  Review of Systems     Objective:   Physical Exam  Constitutional: She appears well-developed and well-nourished.  Cardiovascular: Normal rate and normal heart sounds.   Pulmonary/Chest: Effort normal and breath sounds normal.    Abscess right lower back with central pustule, surrounding cellulitis and erythema is present over 8 cm  Procedure: Area was prepped in sterile fashion. An elliptical incision was made to excise the central lesion. Large volume of pus was evacuated. This was sent for culture. Wound was thoroughly cleaned out and packed with 1 inch iodoform and covered with gauze.  Abdominal: Soft. She exhibits no distension. There is no tenderness.       Assessment:     Right lower back abscess    Plan:     This was incised and drained as above. Start doxycycline twice a day. I gave her a prescription for oxycodone as well. Return to urgent clinic tomorrow for packing change and reevaluation.

## 2013-11-03 ENCOUNTER — Ambulatory Visit (INDEPENDENT_AMBULATORY_CARE_PROVIDER_SITE_OTHER): Payer: 59 | Admitting: General Surgery

## 2013-11-03 ENCOUNTER — Encounter (INDEPENDENT_AMBULATORY_CARE_PROVIDER_SITE_OTHER): Payer: Self-pay | Admitting: General Surgery

## 2013-11-03 ENCOUNTER — Telehealth (INDEPENDENT_AMBULATORY_CARE_PROVIDER_SITE_OTHER): Payer: Self-pay

## 2013-11-03 VITALS — BP 102/68 | HR 68 | Temp 97.0°F | Resp 18 | Ht 66.0 in | Wt 154.4 lb

## 2013-11-03 DIAGNOSIS — L03319 Cellulitis of trunk, unspecified: Secondary | ICD-10-CM

## 2013-11-03 DIAGNOSIS — L02219 Cutaneous abscess of trunk, unspecified: Secondary | ICD-10-CM

## 2013-11-03 DIAGNOSIS — L02212 Cutaneous abscess of back [any part, except buttock]: Secondary | ICD-10-CM

## 2013-11-03 NOTE — Telephone Encounter (Signed)
Pt is calling to report that her packing has almost completely come out.  It is hanging from the wound.  I advised that she cut the excess Iodoform making sure she doesn't touch the wound, and then cover with a clean, dry dressing.  She is scheduled to see Dr. Barry Dienes in urgent office this afternoon.

## 2013-11-03 NOTE — Patient Instructions (Addendum)
Remove packing tomorrow, then may shower.   Use handheld shower to clean.    Redress with dry dressing.    May use gauze or maxi pad to dress.    Follow up later this week or next week.  They are going to check Dr. Biagio Borg schedule if he is available.  If not, we will get you in Friday or Monday.

## 2013-11-04 ENCOUNTER — Telehealth (INDEPENDENT_AMBULATORY_CARE_PROVIDER_SITE_OTHER): Payer: Self-pay

## 2013-11-04 NOTE — Progress Notes (Signed)
Subjective:     Patient ID: Patricia Diaz, female   DOB: [birth year], 58 y.o.   MRN: [id number]  HPI Patient presents approximately one day status post I&D of an abscess by Dr. Grandville Silos yesterday. She states the pain is improved. She has had copious drainage.  She is still fairly sore. She has taken some pain medication, that she did not take any before she drove to clinic.  She denies fevers and chills.  Review of Systems     Objective:   Physical Exam  Constitutional: She appears well-developed and well-nourished.  Cardiovascular: Normal rate and normal heart sounds.   Pulmonary/Chest: Effort normal and breath sounds normal.    Continues to have some foul drainage.  Erythema is slightly more, but induration is less.  Will still need early follow up.  Abdominal: Soft. She exhibits no distension. There is no tenderness.       Assessment:     Right lower back abscess    Plan:     Continue antibiotics and dressing changes. Followup later this week with urgent office.  I'm concerned about her level of cellulitis. Over this point her cultures will be back. I did advise her to take out the dressing in the shower and allow the warm water to run in. I used peroxide on the wound today.

## 2013-11-04 NOTE — Telephone Encounter (Signed)
LMOV pt's f/u appointment with Dr. Grandville Silos is 11/05/13 at 4:15 p.m.

## 2013-11-05 ENCOUNTER — Encounter (INDEPENDENT_AMBULATORY_CARE_PROVIDER_SITE_OTHER): Payer: 59 | Admitting: General Surgery

## 2013-11-08 ENCOUNTER — Other Ambulatory Visit: Payer: Self-pay | Admitting: Internal Medicine

## 2013-11-09 ENCOUNTER — Encounter (INDEPENDENT_AMBULATORY_CARE_PROVIDER_SITE_OTHER): Payer: 59 | Admitting: General Surgery

## 2013-11-09 NOTE — Telephone Encounter (Signed)
HCTZ refilled per protocol. JG//CMA 

## 2013-11-11 ENCOUNTER — Other Ambulatory Visit: Payer: Self-pay | Admitting: *Deleted

## 2013-11-11 MED ORDER — ATORVASTATIN CALCIUM 20 MG PO TABS: ORAL_TABLET | ORAL | Status: DC

## 2013-12-04 ENCOUNTER — Other Ambulatory Visit: Payer: Self-pay | Admitting: Gastroenterology

## 2013-12-04 ENCOUNTER — Encounter: Payer: Self-pay | Admitting: *Deleted

## 2013-12-04 ENCOUNTER — Telehealth: Payer: Self-pay | Admitting: *Deleted

## 2013-12-04 NOTE — Telephone Encounter (Signed)
I spoke with Dawn at [facility] Approved until 12-04-2014 Approval number [id number]

## 2013-12-04 NOTE — Telephone Encounter (Signed)
Received via fax from [facility] a request for a prior auth for Linzess  I called [phone] I looked through patients chart in [facility] and patient has tried: Miralax Colace Amitiza Senna Fleet Enemas   Last year's prior auth was approved until 10-2013.

## 2013-12-09 ENCOUNTER — Other Ambulatory Visit: Payer: Self-pay | Admitting: Gastroenterology

## 2013-12-09 ENCOUNTER — Other Ambulatory Visit: Payer: Self-pay | Admitting: Internal Medicine

## 2013-12-09 NOTE — Telephone Encounter (Signed)
Rx sent to the pharmacy by e-script.  Pt needs office visit for BP check.//AB/CMA

## 2014-01-14 ENCOUNTER — Other Ambulatory Visit: Payer: Self-pay | Admitting: *Deleted

## 2014-01-14 MED ORDER — HYDROCHLOROTHIAZIDE 12.5 MG PO CAPS: ORAL_CAPSULE | ORAL | Status: DC

## 2014-02-09 ENCOUNTER — Other Ambulatory Visit: Payer: Self-pay | Admitting: Gastroenterology

## 2014-02-10 ENCOUNTER — Other Ambulatory Visit: Payer: Self-pay | Admitting: Gastroenterology

## 2014-02-11 ENCOUNTER — Telehealth: Payer: Self-pay | Admitting: Gastroenterology

## 2014-02-11 MED ORDER — LINACLOTIDE 145 MCG PO CAPS: 145.0000 ug | ORAL_CAPSULE | Freq: Every day | ORAL | Status: DC

## 2014-02-11 NOTE — Telephone Encounter (Signed)
Ok to refill 

## 2014-02-11 NOTE — Telephone Encounter (Signed)
Patient has a follow up visit with you for 04-01-14 Is it ok to refill Linzess?

## 2014-03-11 ENCOUNTER — Other Ambulatory Visit: Payer: Self-pay | Admitting: Internal Medicine

## 2014-03-29 ENCOUNTER — Encounter: Payer: Self-pay | Admitting: Internal Medicine

## 2014-04-01 ENCOUNTER — Ambulatory Visit (INDEPENDENT_AMBULATORY_CARE_PROVIDER_SITE_OTHER): Payer: 59 | Admitting: Internal Medicine

## 2014-04-01 ENCOUNTER — Ambulatory Visit: Payer: BC Managed Care – PPO | Admitting: Internal Medicine

## 2014-04-01 ENCOUNTER — Encounter: Payer: Self-pay | Admitting: Internal Medicine

## 2014-04-01 VITALS — BP 104/70 | HR 60 | Ht 66.0 in | Wt 158.4 lb

## 2014-04-01 DIAGNOSIS — K5900 Constipation, unspecified: Secondary | ICD-10-CM

## 2014-04-01 DIAGNOSIS — K5904 Chronic idiopathic constipation: Secondary | ICD-10-CM

## 2014-04-01 DIAGNOSIS — Z86010 Personal history of colonic polyps: Secondary | ICD-10-CM

## 2014-04-01 MED ORDER — LINACLOTIDE 145 MCG PO CAPS: 145.0000 ug | ORAL_CAPSULE | Freq: Every day | ORAL | Status: DC

## 2014-04-01 NOTE — Progress Notes (Signed)
   Subjective:    Patient ID: Patricia Diaz, female    DOB: [birth year], 58 y.o.   MRN: [id number]  HPI Patricia Diaz is a 58 year old female with a past medical history of adenomatous colon polyp, aortic dissection of unknown etiology greater than 10 years ago, hypertension, migraines and chronic functional constipation who is seen in followup. She was previous he managed by Dr. Verl Blalock prior to his retirement. This is her first visit with me. Her constipation is long-standing and she has tried numerous laxatives including MiraLax, Colace, lubiprostone, senna and fleets enemas. She has been taking Linzess 145 mcg daily for the last few months and has found this medication to be "life-changing" in a positive manner. She is having a bowel movement every other day and feels complete evacuation. No abdominal pain, blood in her stool or melena. Good appetite. No upper GI complaints including nausea or vomiting. No significant heartburn. No dysphagia or odynophagia. No hepatobiliary complaints.  Last colonoscopy 04/22/2013 -- good breath, left-sided diverticulosis no polyps Patient did have adenomatous colon polyp, 6 mm in size, and 2008 which was removed. --Sharlett Iles recommended 10 year recall after recent normal colonoscopy    Review of Systems As per history of present illness, otherwise negative  Current Medications, Allergies, Past Medical History, Past Surgical History, Family History and Social History were reviewed in [employer] record.     Objective:   Physical Exam BP 104/70  Pulse 60  Ht 5\' 6"  (1.676 m)  Wt 158 lb 6.4 oz (71.85 kg)  BMI 25.58 kg/m2 Constitutional: Well-developed and well-nourished. No distress. HEENT: Normocephalic and atraumatic. Oropharynx is clear and moist. No oropharyngeal exudate. Conjunctivae are normal.  No scleral icterus. Neck: Neck supple. Trachea midline. Cardiovascular: Normal rate, regular rhythm and intact distal  pulses. No M/R/G Pulmonary/chest: Effort normal and breath sounds normal. No wheezing, rales or rhonchi. Abdominal: Soft, nontender, nondistended. Bowel sounds active throughout. There are no masses palpable. No hepatosplenomegaly. Extremities: no clubbing, cyanosis, or edema Lymphadenopathy: No cervical adenopathy noted. Neurological: Alert and oriented to person place and time. Skin: Skin is warm and dry. No rashes noted. Psychiatric: Normal mood and affect. Behavior is normal.     Assessment & Plan:  58 year old female with a past medical history of adenomatous colon polyp, aortic dissection of unknown etiology greater than 10 years ago, hypertension, migraines and chronic functional constipation who is seen in followup.  1. Chronic constipation -- will treated currently with Linzess 145 mcg daily. We will continue with this medication at the same dose. We'll give her a three-month supply with 3 refills. I asked that she notify me if she has change in bowel habits or if the medication loses efficacy. She voices understanding. I will see her again in one year for this issue  2.  History of adenomatous colon polyps/CRC screening -- she had one 6 mm adenoma in 2008, repeat colonoscopy in 2014 without polyps. We discussed guidelines and the fact that she is only had one small adenoma with the most recent exam being normal. 10 year repeat colonoscopy felt reasonable, she would be due in June 2024. She is comfortable and happy with this plan

## 2014-04-01 NOTE — Patient Instructions (Signed)
We have sent the following medications to your pharmacy for you to pick up at your convenience: linzess 145 mcg daily   Follow up in office in 1 year

## 2014-05-07 ENCOUNTER — Other Ambulatory Visit: Payer: Self-pay

## 2014-05-07 MED ORDER — ATORVASTATIN CALCIUM 20 MG PO TABS: ORAL_TABLET | ORAL | Status: DC

## 2014-06-11 ENCOUNTER — Other Ambulatory Visit: Payer: Self-pay | Admitting: *Deleted

## 2014-06-11 ENCOUNTER — Encounter: Payer: Self-pay | Admitting: Internal Medicine

## 2014-06-11 DIAGNOSIS — F909 Attention-deficit hyperactivity disorder, unspecified type: Secondary | ICD-10-CM | POA: Insufficient documentation

## 2014-06-11 MED ORDER — CLONIDINE HCL 0.1 MG PO TABS: ORAL_TABLET | ORAL | Status: DC

## 2014-07-06 ENCOUNTER — Other Ambulatory Visit: Payer: Self-pay

## 2014-07-06 MED ORDER — HYDROCHLOROTHIAZIDE 12.5 MG PO CAPS: ORAL_CAPSULE | ORAL | Status: DC

## 2014-07-08 ENCOUNTER — Encounter: Payer: Self-pay | Admitting: Gastroenterology

## 2014-07-12 ENCOUNTER — Other Ambulatory Visit: Payer: Self-pay

## 2014-07-12 MED ORDER — CLONIDINE HCL 0.1 MG PO TABS: ORAL_TABLET | ORAL | Status: DC

## 2014-07-15 ENCOUNTER — Other Ambulatory Visit: Payer: Self-pay | Admitting: Obstetrics and Gynecology

## 2014-07-15 ENCOUNTER — Ambulatory Visit (INDEPENDENT_AMBULATORY_CARE_PROVIDER_SITE_OTHER): Payer: 59 | Admitting: Internal Medicine

## 2014-07-15 ENCOUNTER — Encounter: Payer: Self-pay | Admitting: Internal Medicine

## 2014-07-15 VITALS — BP 112/80 | HR 67 | Temp 98.1°F | Resp 13 | Ht 66.0 in | Wt 159.0 lb

## 2014-07-15 DIAGNOSIS — Z0000 Encounter for general adult medical examination without abnormal findings: Secondary | ICD-10-CM

## 2014-07-15 DIAGNOSIS — F909 Attention-deficit hyperactivity disorder, unspecified type: Secondary | ICD-10-CM

## 2014-07-15 DIAGNOSIS — E785 Hyperlipidemia, unspecified: Secondary | ICD-10-CM

## 2014-07-15 DIAGNOSIS — D126 Benign neoplasm of colon, unspecified: Secondary | ICD-10-CM

## 2014-07-15 NOTE — Progress Notes (Signed)
Pre visit review using our clinic review tool, if applicable. No additional management support is needed unless otherwise documented below in the visit note. 

## 2014-07-15 NOTE — Progress Notes (Signed)
Subjective:    Patient ID: Patricia Diaz, female    DOB: 1955-11-05, 58 y.o.   MRN: [id number]  HPI  She is here for a physical;acute issues denied.   A heart healthy diet is followed; exercise encompasses walking 2 miles usually 5 days a week without symptoms.    Family history is positive for premature coronary disease. Advanced cholesterol testing reveals  LDL goal is less than100 ; ideally < 70. There is medication compliance with the statin.  Low dose ASA taken.     Review of Systems Specifically denied are  chest pain, palpitations, dyspnea, or claudication.   She is seeing an ADD specialist and has responded to the new prescriptions. Even on medications for ADD her blood pressure average is 118/67 - 70 at home.  She has frequent leg cramps at night but no other myalgias. She has a past history of hypokalemia.       Objective:   Physical Exam   Positive or pertinent findings include: She has minor DIP changes of the hands. There is minimal crepitus of the knees without effusion. Pes planus is present.  Gen.: Healthy and well-nourished in appearance. Alert, appropriate and cooperative throughout exam. Appears younger than stated age  Head: Normocephalic without obvious abnormalities  Eyes: No corneal or conjunctival inflammation noted. Pupils equal round reactive to light and accommodation. Extraocular motion intact.  Ears: External  ear exam reveals  Ears:no significant lesions or deformities. Canals clear .TMs normal. Hearing is grossly normal bilaterally. Nose: External nasal exam reveals no deformity or inflammation. Nasal mucosa are pink and moist. No lesions or exudates noted.   Mouth: Oral mucosa and oropharynx reveal no lesions or exudates. Teeth in good repair. Neck: No deformities, masses, or tenderness noted. Range of motion & Thyroid normal. Lungs: Normal respiratory effort; chest expands symmetrically. Lungs are clear to auscultation without rales,  wheezes, or increased work of breathing. Heart: Normal rate and rhythm. Normal S1 and S2. No gallop, click, or rub. no murmur. Abdomen: Bowel sounds normal; abdomen soft and nontender. No masses, organomegaly or hernias noted. Genitalia:  as per Gyn                                  Musculoskeletal/extremities: No deformity or scoliosis noted of  the thoracic or lumbar spine.  No clubbing, cyanosis, edema, or significant extremity  deformity noted. Range of motion normal .Tone & strength normal. Fingernail / toenail health good. Able to lie down & sit up w/o help. Negative SLR bilaterally Vascular: Carotid, radial artery, dorsalis pedis and  posterior tibial pulses are full and equal. No bruits present. Neurologic: Alert and oriented x3. Deep tendon reflexes symmetrical and normal.  Gait normal  .      Skin: Intact without suspicious lesions or rashes. Lymph: No cervical, axillary lymphadenopathy present. Psych: Mood and affect are normal. Normally interactive                                                                                        Assessment & Plan:   #1 comprehensive  physical exam; no acute findings  Plan: see Orders  & Recommendations

## 2014-07-15 NOTE — Patient Instructions (Signed)
Your next office appointment will be determined based upon review of your pending labs. Those instructions will be transmitted to you through My Chart . 

## 2014-07-19 LAB — CYTOLOGY - PAP

## 2014-07-31 ENCOUNTER — Other Ambulatory Visit: Payer: Self-pay | Admitting: Internal Medicine

## 2014-08-05 ENCOUNTER — Other Ambulatory Visit (INDEPENDENT_AMBULATORY_CARE_PROVIDER_SITE_OTHER): Payer: 59

## 2014-08-05 DIAGNOSIS — Z0000 Encounter for general adult medical examination without abnormal findings: Secondary | ICD-10-CM

## 2014-08-05 LAB — VITAMIN D 25 HYDROXY (VIT D DEFICIENCY, FRACTURES): VITD: 15.49 ng/mL — AB (ref 30.00–100.00)

## 2014-08-05 LAB — HEPATIC FUNCTION PANEL
ALK PHOS: 67 U/L (ref 39–117)
ALT: 26 U/L (ref 0–35)
AST: 24 U/L (ref 0–37)
Albumin: 3.7 g/dL (ref 3.5–5.2)
BILIRUBIN DIRECT: 0.2 mg/dL (ref 0.0–0.3)
BILIRUBIN TOTAL: 1 mg/dL (ref 0.2–1.2)
Total Protein: 7.3 g/dL (ref 6.0–8.3)

## 2014-08-05 LAB — CBC WITH DIFFERENTIAL/PLATELET
Basophils Absolute: 0 10*3/uL (ref 0.0–0.1)
Basophils Relative: 0.5 % (ref 0.0–3.0)
Eosinophils Absolute: 0.1 10*3/uL (ref 0.0–0.7)
Eosinophils Relative: 3.1 % (ref 0.0–5.0)
HCT: 43.9 % (ref 36.0–46.0)
Hemoglobin: 14.7 g/dL (ref 12.0–15.0)
LYMPHS PCT: 23.8 % (ref 12.0–46.0)
Lymphs Abs: 1 10*3/uL (ref 0.7–4.0)
MCHC: 33.4 g/dL (ref 30.0–36.0)
MCV: 92.7 fl (ref 78.0–100.0)
MONOS PCT: 6.6 % (ref 3.0–12.0)
Monocytes Absolute: 0.3 10*3/uL (ref 0.1–1.0)
Neutro Abs: 2.8 10*3/uL (ref 1.4–7.7)
Neutrophils Relative %: 66 % (ref 43.0–77.0)
PLATELETS: 200 10*3/uL (ref 150.0–400.0)
RBC: 4.74 Mil/uL (ref 3.87–5.11)
RDW: 11.9 % (ref 11.5–15.5)
WBC: 4.2 10*3/uL (ref 4.0–10.5)

## 2014-08-05 LAB — BASIC METABOLIC PANEL
BUN: 14 mg/dL (ref 6–23)
CO2: 29 mEq/L (ref 19–32)
Calcium: 9.4 mg/dL (ref 8.4–10.5)
Chloride: 103 mEq/L (ref 96–112)
Creatinine, Ser: 0.7 mg/dL (ref 0.4–1.2)
GFR: 95.24 mL/min (ref >60.00)
Glucose, Bld: 99 mg/dL (ref 70–99)
Potassium: 4.5 mEq/L (ref 3.5–5.1)
Sodium: 141 mEq/L (ref 135–145)

## 2014-08-05 LAB — LIPID PANEL
CHOL/HDL RATIO: 5
Cholesterol: 234 mg/dL — ABNORMAL HIGH (ref 0–200)
HDL: 50.7 mg/dL (ref >39.00)
LDL CALC: 171 mg/dL — AB (ref 0–99)
NONHDL: 183.3
TRIGLYCERIDES: 64 mg/dL (ref 0.0–149.0)
VLDL: 12.8 mg/dL (ref 0.0–40.0)

## 2014-08-05 LAB — TSH: TSH: 0.84 u[IU]/mL (ref 0.35–4.50)

## 2014-08-11 ENCOUNTER — Other Ambulatory Visit: Payer: Self-pay

## 2014-08-11 MED ORDER — HYDROCHLOROTHIAZIDE 12.5 MG PO CAPS: ORAL_CAPSULE | ORAL | Status: DC

## 2014-08-11 MED ORDER — CLONIDINE HCL 0.1 MG PO TABS: ORAL_TABLET | ORAL | Status: DC

## 2014-09-11 ENCOUNTER — Other Ambulatory Visit: Payer: Self-pay | Admitting: Internal Medicine

## 2014-10-04 ENCOUNTER — Other Ambulatory Visit: Payer: Self-pay

## 2014-10-04 MED ORDER — ATORVASTATIN CALCIUM 20 MG PO TABS: ORAL_TABLET | ORAL | Status: DC

## 2014-11-14 ENCOUNTER — Other Ambulatory Visit: Payer: Self-pay | Admitting: Internal Medicine

## 2015-02-14 ENCOUNTER — Ambulatory Visit (INDEPENDENT_AMBULATORY_CARE_PROVIDER_SITE_OTHER): Payer: Commercial Managed Care - PPO | Admitting: Family

## 2015-02-14 ENCOUNTER — Encounter: Payer: Self-pay | Admitting: Family

## 2015-02-14 ENCOUNTER — Other Ambulatory Visit: Payer: Self-pay

## 2015-02-14 VITALS — BP 120/88 | HR 66 | Temp 98.0°F | Resp 18 | Ht 66.0 in | Wt 154.4 lb

## 2015-02-14 DIAGNOSIS — J029 Acute pharyngitis, unspecified: Secondary | ICD-10-CM | POA: Diagnosis not present

## 2015-02-14 MED ORDER — CLONIDINE HCL 0.1 MG PO TABS: ORAL_TABLET | ORAL | Status: DC

## 2015-02-14 MED ORDER — AMOXICILLIN 500 MG PO CAPS: 500.0000 mg | ORAL_CAPSULE | Freq: Two times a day (BID) | ORAL | Status: DC

## 2015-02-14 NOTE — Assessment & Plan Note (Addendum)
In office rapid strep test negative. Cannot rule out bacterial pharyngitis given continued worsening of symptoms. Start amoxicillin. Start over-the-counter medications as needed for sore throat and supportive care. Follow-up if symptoms worsen or fail to improve.

## 2015-02-14 NOTE — Progress Notes (Signed)
Subjective:    Patient ID: Patricia Diaz, female    DOB: [birth year], 59 y.o.   MRN: [id number]  Chief Complaint  Patient presents with  . Sore Throat    x3 days she had a sore throat and said today she was on her way to work and felt bad so she went home to take her temp and it was 101, feels fatigued    HPI:  Patricia Diaz is a 59 y.o. female who presents today for an acute visit.  This is a new problem. Associated symptom of sore throat and fever has been going on for about 3 days. Indicates that she has a fellow co-worker who has strep throat and is concerned she may have it. Denies any modifying factors that make it better or worse including OTC medications. Indicates it has progressively worsened with no signs of improvement. Has not taken any antibiotics recently.   Allergies  Allergen Reactions  . Spironolactone     ? dizziness  This had been prescribed in place of HCTZ because of hypokalemia    Current Outpatient Prescriptions on File Prior to Visit  Medication Sig Dispense Refill  . amphetamine-dextroamphetamine (ADDERALL) 20 MG tablet Take 20 mg by mouth daily.    Marland Kitchen aspirin 81 MG tablet Take 81 mg by mouth daily.     Marland Kitchen atorvastatin (LIPITOR) 20 MG tablet TAKE 1 TABLET BY MOUTH EVERY DAY 90 tablet 1  . clonazePAM (KLONOPIN) 1 MG tablet Take 1 mg by mouth as needed.     . cloNIDine (CATAPRES) 0.1 MG tablet TAKE 1 TABLET BY MOUTH TWICE A DAY 60 tablet 5  . hydrochlorothiazide (MICROZIDE) 12.5 MG capsule TAKE ONE CAPSULE BY MOUTH EVERY DAY 30 capsule 5  . labetalol (NORMODYNE) 200 MG tablet TAKE 1 TABLET (200 MG TOTAL) BY MOUTH 3 (THREE) TIMES DAILY. 270 tablet 1  . Linaclotide (LINZESS) 145 MCG CAPS capsule Take 1 capsule (145 mcg total) by mouth daily. 90 capsule 3  . venlafaxine (EFFEXOR-XR) 150 MG 24 hr capsule Take 150 mg by mouth daily.      Marland Kitchen zolpidem (AMBIEN) 10 MG tablet Take 10 mg by mouth at bedtime as needed.       No current facility-administered  medications on file prior to visit.     Review of Systems  Constitutional: Positive for fever and fatigue. Negative for chills.  HENT: Positive for sore throat. Negative for congestion and sinus pressure.   Respiratory: Negative for cough.   Gastrointestinal: Negative for abdominal pain.  Neurological: Positive for headaches.      Objective:    BP 120/88 mmHg  Pulse 66  Temp(Src) 98 F (36.7 C) (Oral)  Resp 18  Ht 5\' 6"  (1.676 m)  Wt 154 lb 6.4 oz (70.035 kg)  BMI 24.93 kg/m2  SpO2 97% Nursing note and vital signs reviewed.  Physical Exam  Constitutional: She is oriented to person, place, and time. She appears well-developed and well-nourished. No distress.  HENT:  Right Ear: Hearing, tympanic membrane, external ear and ear canal normal.  Left Ear: Hearing, tympanic membrane, external ear and ear canal normal.  Nose: Nose normal. Right sinus exhibits no maxillary sinus tenderness and no frontal sinus tenderness. Left sinus exhibits no maxillary sinus tenderness and no frontal sinus tenderness.  Mouth/Throat: Uvula is midline and mucous membranes are normal. Posterior oropharyngeal erythema present.  Cardiovascular: Normal rate, regular rhythm, normal heart sounds and intact distal pulses.   Pulmonary/Chest: Effort normal and breath sounds normal.  Neurological: She is alert and oriented to person, place, and time.  Skin: Skin is warm and dry.  Psychiatric: She has a normal mood and affect. Her behavior is normal. Judgment and thought content normal.       Assessment & Plan:

## 2015-02-14 NOTE — Patient Instructions (Signed)

## 2015-02-14 NOTE — Progress Notes (Signed)
Pre visit review using our clinic review tool, if applicable. No additional management support is needed unless otherwise documented below in the visit note. 

## 2015-02-21 ENCOUNTER — Other Ambulatory Visit: Payer: Self-pay

## 2015-02-21 MED ORDER — HYDROCHLOROTHIAZIDE 12.5 MG PO CAPS: ORAL_CAPSULE | ORAL | Status: DC

## 2015-04-18 ENCOUNTER — Other Ambulatory Visit: Payer: Self-pay | Admitting: Internal Medicine

## 2015-04-20 ENCOUNTER — Telehealth: Payer: Self-pay | Admitting: Geriatric Medicine

## 2015-04-20 NOTE — Telephone Encounter (Signed)
Left message for patient to call me back to let me know if she has had a mammogram recently.  

## 2015-05-18 ENCOUNTER — Other Ambulatory Visit: Payer: Self-pay | Admitting: Internal Medicine

## 2015-05-19 ENCOUNTER — Other Ambulatory Visit: Payer: Self-pay | Admitting: Emergency Medicine

## 2015-05-19 MED ORDER — LABETALOL HCL 200 MG PO TABS: ORAL_TABLET | ORAL | Status: DC

## 2015-05-24 ENCOUNTER — Other Ambulatory Visit: Payer: Self-pay | Admitting: Emergency Medicine

## 2015-05-24 MED ORDER — CLONIDINE HCL 0.1 MG PO TABS: ORAL_TABLET | ORAL | Status: DC

## 2015-06-19 ENCOUNTER — Other Ambulatory Visit: Payer: Self-pay | Admitting: Internal Medicine

## 2015-06-20 ENCOUNTER — Other Ambulatory Visit: Payer: Self-pay | Admitting: Emergency Medicine

## 2015-06-20 MED ORDER — ATORVASTATIN CALCIUM 20 MG PO TABS: ORAL_TABLET | ORAL | Status: DC

## 2015-07-26 ENCOUNTER — Encounter: Payer: Self-pay | Admitting: Internal Medicine

## 2015-07-26 ENCOUNTER — Other Ambulatory Visit (INDEPENDENT_AMBULATORY_CARE_PROVIDER_SITE_OTHER): Payer: Commercial Managed Care - PPO

## 2015-07-26 ENCOUNTER — Ambulatory Visit (INDEPENDENT_AMBULATORY_CARE_PROVIDER_SITE_OTHER): Payer: Commercial Managed Care - PPO | Admitting: Internal Medicine

## 2015-07-26 VITALS — BP 108/74 | HR 66 | Temp 98.0°F | Resp 16 | Ht 66.0 in | Wt 149.0 lb

## 2015-07-26 DIAGNOSIS — I10 Essential (primary) hypertension: Secondary | ICD-10-CM

## 2015-07-26 DIAGNOSIS — Z0001 Encounter for general adult medical examination with abnormal findings: Secondary | ICD-10-CM | POA: Diagnosis not present

## 2015-07-26 DIAGNOSIS — E785 Hyperlipidemia, unspecified: Secondary | ICD-10-CM | POA: Diagnosis not present

## 2015-07-26 DIAGNOSIS — R232 Flushing: Secondary | ICD-10-CM

## 2015-07-26 DIAGNOSIS — R002 Palpitations: Secondary | ICD-10-CM

## 2015-07-26 DIAGNOSIS — Z0189 Encounter for other specified special examinations: Secondary | ICD-10-CM | POA: Diagnosis not present

## 2015-07-26 DIAGNOSIS — Z0000 Encounter for general adult medical examination without abnormal findings: Secondary | ICD-10-CM

## 2015-07-26 DIAGNOSIS — I7103 Dissection of thoracoabdominal aorta: Secondary | ICD-10-CM

## 2015-07-26 DIAGNOSIS — Z23 Encounter for immunization: Secondary | ICD-10-CM

## 2015-07-26 DIAGNOSIS — E559 Vitamin D deficiency, unspecified: Secondary | ICD-10-CM | POA: Diagnosis not present

## 2015-07-26 LAB — T4, FREE: Free T4: 0.99 ng/dL (ref 0.60–1.60)

## 2015-07-26 LAB — VITAMIN D 25 HYDROXY (VIT D DEFICIENCY, FRACTURES): VITD: 21.23 ng/mL — AB (ref 30.00–100.00)

## 2015-07-26 LAB — MAGNESIUM: Magnesium: 2 mg/dL (ref 1.5–2.5)

## 2015-07-26 LAB — HEPATIC FUNCTION PANEL
ALBUMIN: 4.3 g/dL (ref 3.5–5.2)
ALT: 22 U/L (ref 0–35)
AST: 21 U/L (ref 0–37)
Alkaline Phosphatase: 60 U/L (ref 39–117)
Bilirubin, Direct: 0.2 mg/dL (ref 0.0–0.3)
Total Bilirubin: 1.3 mg/dL — ABNORMAL HIGH (ref 0.2–1.2)
Total Protein: 6.7 g/dL (ref 6.0–8.3)

## 2015-07-26 LAB — CBC WITH DIFFERENTIAL/PLATELET
BASOS ABS: 0 10*3/uL (ref 0.0–0.1)
BASOS PCT: 0.4 % (ref 0.0–3.0)
EOS PCT: 3.2 % (ref 0.0–5.0)
Eosinophils Absolute: 0.1 10*3/uL (ref 0.0–0.7)
HCT: 42.3 % (ref 36.0–46.0)
Hemoglobin: 14.3 g/dL (ref 12.0–15.0)
LYMPHS ABS: 1 10*3/uL (ref 0.7–4.0)
LYMPHS PCT: 22 % (ref 12.0–46.0)
MCHC: 33.8 g/dL (ref 30.0–36.0)
MCV: 91.6 fl (ref 78.0–100.0)
Monocytes Absolute: 0.3 10*3/uL (ref 0.1–1.0)
Monocytes Relative: 7.6 % (ref 3.0–12.0)
NEUTROS ABS: 2.9 10*3/uL (ref 1.4–7.7)
NEUTROS PCT: 66.8 % (ref 43.0–77.0)
PLATELETS: 191 10*3/uL (ref 150.0–400.0)
RBC: 4.62 Mil/uL (ref 3.87–5.11)
RDW: 11.9 % (ref 11.5–15.5)
WBC: 4.4 10*3/uL (ref 4.0–10.5)

## 2015-07-26 LAB — LIPID PANEL
CHOLESTEROL: 151 mg/dL (ref 0–200)
HDL: 55 mg/dL (ref >39.00)
LDL Cholesterol: 77 mg/dL (ref 0–99)
NonHDL: 96.25
TRIGLYCERIDES: 98 mg/dL (ref 0.0–149.0)
Total CHOL/HDL Ratio: 3
VLDL: 19.6 mg/dL (ref 0.0–40.0)

## 2015-07-26 LAB — BASIC METABOLIC PANEL
BUN: 12 mg/dL (ref 6–23)
CHLORIDE: 103 meq/L (ref 96–112)
CO2: 31 mEq/L (ref 19–32)
CREATININE: 0.75 mg/dL (ref 0.40–1.20)
Calcium: 10.1 mg/dL (ref 8.4–10.5)
GFR: 83.35 mL/min (ref >60.00)
Glucose, Bld: 100 mg/dL — ABNORMAL HIGH (ref 70–99)
POTASSIUM: 4.5 meq/L (ref 3.5–5.1)
Sodium: 140 mEq/L (ref 135–145)

## 2015-07-26 MED ORDER — HYDROCHLOROTHIAZIDE 12.5 MG PO CAPS: ORAL_CAPSULE | ORAL | Status: DC

## 2015-07-26 MED ORDER — ATORVASTATIN CALCIUM 20 MG PO TABS: ORAL_TABLET | ORAL | Status: DC

## 2015-07-26 MED ORDER — LABETALOL HCL 200 MG PO TABS: ORAL_TABLET | ORAL | Status: DC

## 2015-07-26 MED ORDER — CLONIDINE HCL 0.1 MG PO TABS: ORAL_TABLET | ORAL | Status: DC

## 2015-07-26 NOTE — Progress Notes (Signed)
Pre visit review using our clinic review tool, if applicable. No additional management support is needed unless otherwise documented below in the visit note. 

## 2015-07-26 NOTE — Patient Instructions (Signed)
To prevent palpitations or premature beats, avoid stimulants such as decongestants, diet pills, nicotine, or caffeine (coffee, tea, cola, or chocolate) to excess.  Your next office appointment will be determined based upon review of your pending labs.  Those written interpretation of the lab results and instructions will be transmitted to you by mail for your records.  Critical results will be called.  Followup as needed for any active or acute issue. Please report any significant change in your symptoms.  The Cardiology referral will be scheduled and you'll be notified of the time.Please call the Referral Co-Ordinator @ [phone] if you have not been notified of appointment time within 7-10 days.

## 2015-07-26 NOTE — Progress Notes (Signed)
Subjective:    Patient ID: Patricia Diaz, female    DOB: 02-Oct-1956, 59 y.o.   MRN: [id number]  HPI The patient is here for a physical to assess status of active health conditions.  PMH, FH, & Social History reviewed & updated.No change in [place] as recorded.  She does eat fried foods but restricts salt. She has red meat occasionally. She will walk 1.5 miles per day at least 4 days a week without cardiopulmonary symptoms.  Despite absence of symptoms with  walking ; at work 3 weeks ago she was pushing a cart with 50 pound pallet on it and developed severe flushing diffusely. This was associated with profound diaphoresis. It was also associated with migraine type headache. She denied any associated diarrhea.  She had similar symptoms 9/24 simply picking up limbs in her yard. Again she experienced flushing and migraine type headache.  She describes intermittent palpitations at rest occurring 3-4 times per week. She will drink one cup of coffee a day and 6 ounces of diet cola daily. She denies intake of other stimulants.  Colonoscopy was completed in 2014 by Dr Hilarie Fredrickson; follow-up is recommended 2024.  She has a follow-up appointment 08/12/15 for CT at [facility] by Dr. Nicola Girt, CVTS to monitor the aortic aneurysm.   Review of Systems She describes intermittent blurring of vision with intermittent black spots in her vision. There has been no associated diplopia or frank vision loss. She is overdue to see the Ophthalmologist  She's been taking Linzess which controls her constipation. She has no other GI symptoms.    Objective:   Physical Exam  Pertinent or positive findings include: Affect is markedly flat. Pedal pulses are decreased, especially posterior tibial pulses. Minor crepitus is present at the knees. She has DIP osteoarthritic changes.  General appearance :adequately nourished; in no distress.  Eyes: No conjunctival inflammation or scleral icterus is present.  Oral exam:  Lips and  gums are healthy appearing.There is no oropharyngeal erythema or exudate noted. Dental hygiene is good.  Heart:  Normal rate and regular rhythm. S1 and S2 normal without gallop, murmur, click, rub or other extra sounds    Lungs:Chest clear to auscultation; no wheezes, rhonchi,rales ,or rubs present.No increased work of breathing.   Abdomen: bowel sounds normal, soft and non-tender without masses, organomegaly or hernias noted.  No guarding or rebound.   Vascular : all pulses equal ; no bruits present.  Skin:Warm & dry.  Intact without suspicious lesions or rashes ; no tenting or jaundice   Lymphatic: No lymphadenopathy is noted about the head, neck, axilla.   Neuro: Strength, tone & DTRs normal.   When I returned to the room to review the EKG she became very tearful as she described being fired 07/22/15. She states that her employer an [employer] alleges she took alcohol from a company function. She states that the employees were told to take home leftover food and beverages.She describes working 47 hours per week with minimal breaks. She is thinking about applying for disability.  I recommended that she contact Dr. Caprice Beaver, her Psychiatrist, as soon as possible to address the new stressors.      Assessment & Plan:  #1 comprehensive physical exam; no acute findings  #2 exertional flushing with headache and diaphoresis. Carcinoid syndrome must be included in the differential; but this is very unlikely.  #3 palpitations at rest. EKG reveals diffuse nonspecific T changes. No oral EKGs are available for review or comparison.  #4 exogenous stress  Plan: see Orders  & Recommendations.

## 2015-07-28 ENCOUNTER — Telehealth: Payer: Self-pay | Admitting: Internal Medicine

## 2015-07-28 MED ORDER — MELOXICAM 7.5 MG PO TABS: ORAL_TABLET | ORAL | Status: DC

## 2015-07-28 NOTE — Telephone Encounter (Signed)
Would like to know if she could have a script for her back pain

## 2015-07-28 NOTE — Telephone Encounter (Signed)
RX is being sent in to Pharm. Pt has been informed.

## 2015-07-29 ENCOUNTER — Other Ambulatory Visit (INDEPENDENT_AMBULATORY_CARE_PROVIDER_SITE_OTHER): Payer: Commercial Managed Care - PPO

## 2015-07-29 DIAGNOSIS — R739 Hyperglycemia, unspecified: Secondary | ICD-10-CM

## 2015-07-29 LAB — HEMOGLOBIN A1C: HEMOGLOBIN A1C: 5.8 % (ref 4.6–6.5)

## 2015-08-11 ENCOUNTER — Other Ambulatory Visit: Payer: Self-pay | Admitting: Internal Medicine

## 2015-08-12 ENCOUNTER — Ambulatory Visit: Payer: Commercial Managed Care - PPO | Admitting: Cardiology

## 2015-08-17 ENCOUNTER — Other Ambulatory Visit: Payer: Self-pay | Admitting: Internal Medicine

## 2015-08-22 NOTE — Progress Notes (Signed)
HPI: 59 year old female for evaluation of chest pain. CT September 2009 showed thoracic aortic dissection involving the abdominal aorta to just below the renal arteries. Echocardiogram September 2009 showed normal LV function, Mild aortic insufficiency and mild right ventricular enlargement. Carotid Dopplers June 2011 normal. Patient states that for the past 6 months she has had occasional chest pain. The pain is in the left chest area and described as a sharp pain. It occurs both with exertion and at rest. It resolves after 2 minutes. She rubs her chest with improvement. No radiation or associated symptoms. She denies dyspnea, orthopnea or syncope. Occasional brief palpitations described as heart racing when she feels anxious.  Current Outpatient Prescriptions  Medication Sig Dispense Refill  . amphetamine-dextroamphetamine (ADDERALL) 20 MG tablet Take 20 mg by mouth daily.    Marland Kitchen aspirin 81 MG tablet Take 81 mg by mouth daily.     Marland Kitchen atorvastatin (LIPITOR) 20 MG tablet TAKE 1 TABLET BY MOUTH EVERY DAY 90 tablet 3  . busPIRone (BUSPAR) 15 MG tablet Take 7.5 mg by mouth 2 (two) times daily.  0  . clonazePAM (KLONOPIN) 1 MG tablet Take 1 mg by mouth as needed.     . cloNIDine (CATAPRES) 0.1 MG tablet TAKE 1 TABLET BY MOUTH TWICE A DAY 180 tablet 3  . hydrochlorothiazide (MICROZIDE) 12.5 MG capsule TAKE ONE CAPSULE BY MOUTH EVERY DAY 90 capsule 3  . labetalol (NORMODYNE) 200 MG tablet TAKE 1 TABLET (200 MG TOTAL) BY MOUTH 3 (THREE) TIMES DAILY. 270 tablet 1  . LINZESS 145 MCG CAPS capsule TAKE 1 CAPSULE (145 MCG TOTAL) BY MOUTH DAILY. 90 capsule 3  . venlafaxine (EFFEXOR-XR) 150 MG 24 hr capsule Take 150 mg by mouth daily.      Marland Kitchen venlafaxine XR (EFFEXOR-XR) 75 MG 24 hr capsule Take 75 mg by mouth every morning.  5  . zolpidem (AMBIEN) 10 MG tablet Take 10 mg by mouth at bedtime as needed.       No current facility-administered medications for this visit.    Allergies  Allergen Reactions  .  Hydrocodone-Acetaminophen Nausea Only  . Spironolactone     ? dizziness  This had been prescribed in place of HCTZ because of hypokalemia     Past Medical History  Diagnosis Date  . Aortic dissection ([place])     Dr Nicola Girt, [place]  . Hx of migraines     Topamax prophylaxis  . Hypertension   . Constipation, chronic     Linzess  . Hyperlipidemia   . IBS (irritable bowel syndrome)     Past Surgical History  Procedure Laterality Date  . Foot surgery Bilateral   . Lumbar fusion      Dr Hal Neer, [place]  . Tonsillectomy and adenoidectomy    . Colonoscopy  2014  . Abdominal hysterectomy      & USO for fibroids    Social History   Social History  . Marital Status: Married    Spouse Name: N/A  . Number of Children: 1  . Years of Education: N/A   Occupational History  . Not on file.   Social History Main Topics  . Smoking status: Never Smoker   . Smokeless tobacco: Never Used  . Alcohol Use: Yes     Comment: rarely  . Drug Use: No  . Sexual Activity: Not on file   Other Topics Concern  . Not on file   Social History Narrative    Family History  Problem Relation Age of Onset  . Heart attack Father     in 23s  . Hypertension Father   . Transient ischemic attack Mother     mini strokes  . Hypertension Mother   . Diabetes Neg Hx   . Cancer Neg Hx   . Colon cancer Neg Hx   . Liver disease Neg Hx   . Kidney disease Neg Hx     ROS: no fevers or chills, productive cough, hemoptysis, dysphasia, odynophagia, melena, hematochezia, dysuria, hematuria, rash, seizure activity, orthopnea, PND, pedal edema, claudication. Remaining systems are negative.  Physical Exam:   Blood pressure 98/82, pulse 73, height 5\' 6"  (1.676 m), weight 67.042 kg (147 lb 12.8 oz).  General:  Well developed/well nourished in NAD Skin warm/dry Patient not depressed No peripheral clubbing Back-normal HEENT-normal/normal eyelids Neck supple/normal carotid upstroke bilaterally; no bruits; no  JVD; no thyromegaly chest - CTA/ normal expansion CV - RRR/normal S1 and S2; no murmurs, rubs or gallops;  PMI nondisplaced Abdomen -NT/ND, no HSM, no mass, + bowel sounds, no bruit 2+ femoral pulses, no bruits Ext-no edema, chords, 2+ DP Neuro-grossly nonfocal  ECG 07/26/2015-sinus rhythm, RV conduction delay, nonspecific ST changes.

## 2015-08-23 ENCOUNTER — Encounter: Payer: Self-pay | Admitting: *Deleted

## 2015-08-23 ENCOUNTER — Encounter: Payer: Self-pay | Admitting: Cardiology

## 2015-08-23 ENCOUNTER — Ambulatory Visit (INDEPENDENT_AMBULATORY_CARE_PROVIDER_SITE_OTHER): Payer: Commercial Managed Care - PPO | Admitting: Cardiology

## 2015-08-23 VITALS — BP 98/82 | HR 73 | Ht 66.0 in | Wt 147.8 lb

## 2015-08-23 DIAGNOSIS — E785 Hyperlipidemia, unspecified: Secondary | ICD-10-CM

## 2015-08-23 DIAGNOSIS — R072 Precordial pain: Secondary | ICD-10-CM | POA: Diagnosis not present

## 2015-08-23 DIAGNOSIS — I10 Essential (primary) hypertension: Secondary | ICD-10-CM

## 2015-08-23 DIAGNOSIS — I359 Nonrheumatic aortic valve disorder, unspecified: Secondary | ICD-10-CM

## 2015-08-23 DIAGNOSIS — I351 Nonrheumatic aortic (valve) insufficiency: Secondary | ICD-10-CM | POA: Insufficient documentation

## 2015-08-23 DIAGNOSIS — R079 Chest pain, unspecified: Secondary | ICD-10-CM | POA: Diagnosis not present

## 2015-08-23 DIAGNOSIS — I7103 Dissection of thoracoabdominal aorta: Secondary | ICD-10-CM

## 2015-08-23 NOTE — Patient Instructions (Signed)
Medication Instructions:   NO CHANGE  Testing/Procedures:  Your physician has requested that you have an echocardiogram. Echocardiography is a painless test that uses sound waves to create images of your heart. It provides your doctor with information about the size and shape of your heart and how well your heart's chambers and valves are working. This procedure takes approximately one hour. There are no restrictions for this procedure.   Your physician has requested that you have a lexiscan myoview. For further information please visit [email or web address]. Please follow instruction sheet, as given.    Follow-Up:  Your physician recommends that you schedule a follow-up appointment in: AS NEEDED PENDING TEST RESULTS      

## 2015-08-23 NOTE — Assessment & Plan Note (Signed)
Patient symptoms are somewhat atypical. I will plan a Lexiscan nuclear study for risk stratification.We will not exercise given history of aortic dissection which is being treated medically.

## 2015-08-23 NOTE — Assessment & Plan Note (Signed)
Patient's dissecting thoracic/aortic aneurysm is followed at [facility]. Apparently there is some consideration of repairing in the near future. Continue tight blood pressure control

## 2015-08-23 NOTE — Assessment & Plan Note (Signed)
Blood pressure is on the low side which is appropriate given history of aortic dissection. She does have some orthostatic symptoms but not particularly bothersome.

## 2015-08-23 NOTE — Assessment & Plan Note (Signed)
Continue statin. 

## 2015-08-23 NOTE — Assessment & Plan Note (Signed)
Patient with history of mild aortic insufficiency. Given history of thoracoabdominal dissection I will repeat echocardiogram to reassess aortic insufficiency and make sure it has not progressed towards the aortic valve.

## 2015-08-31 ENCOUNTER — Telehealth (HOSPITAL_COMMUNITY): Payer: Self-pay

## 2015-08-31 NOTE — Telephone Encounter (Signed)
Encounter complete. 

## 2015-09-02 ENCOUNTER — Other Ambulatory Visit: Payer: Self-pay

## 2015-09-02 ENCOUNTER — Ambulatory Visit (HOSPITAL_BASED_OUTPATIENT_CLINIC_OR_DEPARTMENT_OTHER): Payer: Commercial Managed Care - PPO

## 2015-09-02 ENCOUNTER — Ambulatory Visit (HOSPITAL_COMMUNITY)
Admission: RE | Admit: 2015-09-02 | Discharge: 2015-09-02 | Disposition: A | Discharge status: Home / Self Care | Payer: Commercial Managed Care - PPO | Source: Ambulatory Visit | Attending: Cardiovascular Disease | Admitting: Cardiovascular Disease

## 2015-09-02 DIAGNOSIS — I071 Rheumatic tricuspid insufficiency: Secondary | ICD-10-CM | POA: Diagnosis not present

## 2015-09-02 DIAGNOSIS — Z8249 Family history of ischemic heart disease and other diseases of the circulatory system: Secondary | ICD-10-CM | POA: Diagnosis not present

## 2015-09-02 DIAGNOSIS — I359 Nonrheumatic aortic valve disorder, unspecified: Secondary | ICD-10-CM | POA: Diagnosis present

## 2015-09-02 DIAGNOSIS — R079 Chest pain, unspecified: Secondary | ICD-10-CM | POA: Diagnosis not present

## 2015-09-02 DIAGNOSIS — R5383 Other fatigue: Secondary | ICD-10-CM | POA: Diagnosis not present

## 2015-09-02 DIAGNOSIS — I517 Cardiomegaly: Secondary | ICD-10-CM | POA: Insufficient documentation

## 2015-09-02 DIAGNOSIS — R002 Palpitations: Secondary | ICD-10-CM | POA: Insufficient documentation

## 2015-09-02 DIAGNOSIS — I351 Nonrheumatic aortic (valve) insufficiency: Secondary | ICD-10-CM | POA: Insufficient documentation

## 2015-09-02 DIAGNOSIS — I77810 Thoracic aortic ectasia: Secondary | ICD-10-CM | POA: Insufficient documentation

## 2015-09-02 LAB — MYOCARDIAL PERFUSION IMAGING
CHL CUP NUCLEAR SSS: 3
CHL CUP RESTING HR STRESS: 53 {beats}/min
CSEPPHR: 73 {beats}/min
LV dias vol: 96 mL
LVSYSVOL: 46 mL
NUC STRESS TID: 1.07
SDS: 2
SRS: 1

## 2015-09-02 MED ORDER — TECHNETIUM TC 99M SESTAMIBI GENERIC - CARDIOLITE
31.8000 | Freq: Once | INTRAVENOUS | Status: AC | PRN
  Administered 2015-09-02: 31.8 via INTRAVENOUS

## 2015-09-02 MED ORDER — TECHNETIUM TC 99M SESTAMIBI GENERIC - CARDIOLITE
10.3000 | Freq: Once | INTRAVENOUS | Status: AC | PRN
  Administered 2015-09-02: 10 via INTRAVENOUS

## 2015-09-02 MED ORDER — REGADENOSON 0.4 MG/5ML IV SOLN
0.4000 mg | Freq: Once | INTRAVENOUS | Status: AC
  Administered 2015-09-02: 0.4 mg via INTRAVENOUS

## 2015-09-13 ENCOUNTER — Telehealth: Payer: Self-pay | Admitting: Internal Medicine

## 2015-09-13 ENCOUNTER — Ambulatory Visit (INDEPENDENT_AMBULATORY_CARE_PROVIDER_SITE_OTHER): Payer: Commercial Managed Care - PPO

## 2015-09-13 DIAGNOSIS — Z23 Encounter for immunization: Secondary | ICD-10-CM

## 2015-09-13 NOTE — Telephone Encounter (Signed)
This is the patient who will be in at 1 for Dtap and TB test Thank  you

## 2015-09-13 NOTE — Telephone Encounter (Signed)
Done

## 2015-10-07 ENCOUNTER — Emergency Department
Admission: EM | Admit: 2015-10-07 | Discharge: 2015-10-07 | Disposition: A | Discharge status: Home / Self Care | Payer: Self-pay | Source: Home / Self Care

## 2015-10-07 DIAGNOSIS — Z111 Encounter for screening for respiratory tuberculosis: Secondary | ICD-10-CM

## 2015-10-07 MED ORDER — TUBERCULIN PPD 5 UNIT/0.1ML ID SOLN
5.0000 [IU] | Freq: Once | INTRADERMAL | Status: DC
  Administered 2015-10-07: 5 [IU] via INTRADERMAL

## 2015-10-07 NOTE — ED Notes (Signed)
Pt is here for PPD placement, placed in [facility].

## 2015-10-09 ENCOUNTER — Emergency Department
Admission: EM | Admit: 2015-10-09 | Discharge: 2015-10-09 | Disposition: A | Discharge status: Home / Self Care | Payer: Self-pay | Source: Home / Self Care

## 2015-10-09 LAB — TB SKIN TEST: TB SKIN TEST: NEGATIVE

## 2015-12-05 ENCOUNTER — Encounter: Payer: Self-pay | Admitting: *Deleted

## 2015-12-05 ENCOUNTER — Encounter: Payer: Self-pay | Admitting: Family Medicine

## 2015-12-05 ENCOUNTER — Ambulatory Visit (INDEPENDENT_AMBULATORY_CARE_PROVIDER_SITE_OTHER): Payer: Commercial Managed Care - PPO | Admitting: Family Medicine

## 2015-12-05 ENCOUNTER — Ambulatory Visit: Payer: Commercial Managed Care - PPO | Admitting: Family Medicine

## 2015-12-05 VITALS — BP 100/70 | HR 71 | Temp 98.4°F | Ht 66.0 in | Wt 150.5 lb

## 2015-12-05 DIAGNOSIS — J069 Acute upper respiratory infection, unspecified: Secondary | ICD-10-CM

## 2015-12-05 LAB — POCT INFLUENZA A/B
Influenza A, POC: NEGATIVE
Influenza B, POC: NEGATIVE

## 2015-12-05 MED ORDER — HYDROCOD POLST-CPM POLST ER 10-8 MG/5ML PO SUER: 5.0000 mL | Freq: Two times a day (BID) | ORAL | Status: DC | PRN

## 2015-12-05 NOTE — Patient Instructions (Addendum)
BEFORE YOU LEAVE: -rapid flu test -Please schedule new patient visit with [place] provider given Dr. Linna Darner is retiring   INSTRUCTIONS FOR UPPER RESPIRATORY INFECTION:  -plenty of rest and fluids  -nasal saline wash 2-3 times daily (use prepackaged nasal saline or bottled/distilled water if making your own)   -can use AFRIN nasal spray for drainage and nasal congestion - but do NOT use longer then 3-4 days  -can use tylenol (in no history of liver disease) or ibuprofen (if no history of kidney disease, bowel bleeding or significant heart disease) as directed for aches and sorethroat  -in the winter time, using a humidifier at night is helpful (please follow cleaning instructions)  -if you are taking a cough medication - use only as directed, may also try a teaspoon of honey to coat the throat and throat lozenges. If given a cough medication with codeine or hydrocodone or other narcotic please be advised that this contains a strong and  potentially addicting medication. Please follow instructions carefully, take as little as possible and only use AS NEEDED for severe cough. Discuss potential side effects with your pharmacy. Please do not drive or operate machinery while taking these types of medications. Please do not take other sedating medications, drugs or alcohol while taking this medication without discussing with your doctor.  -for sore throat, salt water gargles can help  -follow up if you have fevers, facial pain, tooth pain, difficulty breathing or are worsening or symptoms persist longer then expected  Upper Respiratory Infection, Adult An upper respiratory infection (URI) is also known as the common cold. It is often caused by a type of germ (virus). Colds are easily spread (contagious). You can pass it to others by kissing, coughing, sneezing, or drinking out of the same glass. Usually, you get better in 1 to 3  weeks.  However, the cough can last for even longer. HOME CARE   Only  take medicine as told by your doctor. Follow instructions provided above.  Drink enough water and fluids to keep your pee (urine) clear or pale yellow.  Get plenty of rest.  Return to work when your temperature is < 100 for 24 hours or as told by your doctor. You may use a face mask and wash your hands to stop your cold from spreading. GET HELP RIGHT AWAY IF:   After the first few days, you feel you are getting worse.  You have questions about your medicine.  You have chills, shortness of breath, or red spit (mucus).  You have pain in the face for more then 1-2 days, especially when you bend forward.  You have a fever, puffy (swollen) neck, pain when you swallow, or white spots in the back of your throat.  You have a bad headache, ear pain, sinus pain, or chest pain.  You have a high-pitched whistling sound when you breathe in and out (wheezing).  You cough up blood.  You have sore muscles or a stiff neck. MAKE SURE YOU:   Understand these instructions.  Will watch your condition.  Will get help right away if you are not doing well or get worse. Document Released: 04/02/2008 Document Revised: 01/07/2012 Document Reviewed: 01/20/2014 [facility] Patient Information 2015 [place], [place]. This information is not intended to replace advice given to you by your health care provider. Make sure you discuss any questions you have with your health care provider.

## 2015-12-05 NOTE — Addendum Note (Signed)
Addended by: Agnes Lawrence on: 12/05/2015 10:51 AM   Modules accepted: Orders

## 2015-12-05 NOTE — Progress Notes (Signed)
HPI:  URI/Flu like illness: -started: 2-4 days ago -symptoms:nasal congestion, sore throat, cough, fever - now trending down, body aches -denies: SOB, NVD, tooth pain. Sinus pain -has tried: tylenol -sick contacts/travel/risks: coworker with similar symptoms -wants cough medication tussionex (denies allergy to this and was rxd by pcp on oc) as this was provided by PCP in the past and helps with cough when sick - reports is not taking Ambien or klonopin currently -had flu shot  ROS: See pertinent positives and negatives per HPI.  Past Medical History  Diagnosis Date  . Aortic dissection ([place])     Dr Nicola Girt, [place]  . Hx of migraines     Topamax prophylaxis  . Hypertension   . Constipation, chronic     Linzess  . Hyperlipidemia   . IBS (irritable bowel syndrome)     Past Surgical History  Procedure Laterality Date  . Foot surgery Bilateral   . Lumbar fusion      Dr Hal Neer, [place]  . Tonsillectomy and adenoidectomy    . Colonoscopy  2014  . Abdominal hysterectomy      & USO for fibroids    Family History  Problem Relation Age of Onset  . Heart attack Father     in 68s  . Hypertension Father   . Transient ischemic attack Mother     mini strokes  . Hypertension Mother   . Diabetes Neg Hx   . Cancer Neg Hx   . Colon cancer Neg Hx   . Liver disease Neg Hx   . Kidney disease Neg Hx     Social History   Social History  . Marital Status: Married    Spouse Name: N/A  . Number of Children: 1  . Years of Education: N/A   Social History Main Topics  . Smoking status: Never Smoker   . Smokeless tobacco: Never Used  . Alcohol Use: Yes     Comment: rarely  . Drug Use: No  . Sexual Activity: Not Asked   Other Topics Concern  . None   Social History Narrative     Current outpatient prescriptions:  .  amphetamine-dextroamphetamine (ADDERALL) 20 MG tablet, Take 20 mg by mouth daily., Disp: , Rfl:  .  aspirin 81 MG tablet, Take 81 mg by mouth daily. , Disp: ,  Rfl:  .  atorvastatin (LIPITOR) 20 MG tablet, TAKE 1 TABLET BY MOUTH EVERY DAY, Disp: 90 tablet, Rfl: 3 .  cloNIDine (CATAPRES) 0.1 MG tablet, TAKE 1 TABLET BY MOUTH TWICE A DAY, Disp: 180 tablet, Rfl: 3 .  hydrochlorothiazide (MICROZIDE) 12.5 MG capsule, TAKE ONE CAPSULE BY MOUTH EVERY DAY, Disp: 90 capsule, Rfl: 3 .  labetalol (NORMODYNE) 200 MG tablet, TAKE 1 TABLET (200 MG TOTAL) BY MOUTH 3 (THREE) TIMES DAILY., Disp: 270 tablet, Rfl: 1 .  LINZESS 145 MCG CAPS capsule, TAKE 1 CAPSULE (145 MCG TOTAL) BY MOUTH DAILY., Disp: 90 capsule, Rfl: 3 .  venlafaxine XR (EFFEXOR-XR) 75 MG 24 hr capsule, Take 75 mg by mouth every morning., Disp: , Rfl: 5 .  chlorpheniramine-HYDROcodone (TUSSIONEX PENNKINETIC ER) 10-8 MG/5ML SUER, Take 5 mLs by mouth every 12 (twelve) hours as needed for cough., Disp: 115 mL, Rfl: 0 .  clonazePAM (KLONOPIN) 1 MG tablet, Take 1 mg by mouth as needed. Reported on 12/05/2015, Disp: , Rfl:  .  zolpidem (AMBIEN) 10 MG tablet, Take 10 mg by mouth at bedtime as needed. Reported on 12/05/2015, Disp: , Rfl:   EXAM:  Filed Vitals:   12/05/15 1012  BP: 100/70  Pulse: 71  Temp: 98.4 F (36.9 C)    Body mass index is 24.3 kg/(m^2).  GENERAL: vitals reviewed and listed above, alert, oriented, appears well hydrated and in no acute distress  HEENT: atraumatic, conjunttiva clear, no obvious abnormalities on inspection of external nose and ears, normal appearance of ear canals and TMs, clear nasal congestion, mild post oropharyngeal erythema with PND, no tonsillar edema or exudate, no sinus TTP  NECK: no obvious masses on inspection  LUNGS: clear to auscultation bilaterally, no wheezes, rales or rhonchi, good air movement  CV: HRRR, no peripheral edema  MS: moves all extremities without noticeable abnormality  PSYCH: pleasant and cooperative, no obvious depression or anxiety  ASSESSMENT AND PLAN:  Discussed the following assessment and plan:  Acute upper respiratory  infection  -given HPI and exam findings today, a serious infection or illness is unlikely. We discussed potential etiologies, with VURI being most likely w/ possible mild flu - seems to be resolving. We discussed treatment side effects, likely course, antibiotic misuse, transmission, and signs of developing a serious illness. -she opted for rapid flu test, but opted against tamiflu after discussion risks/benefits -discussed risks codeine cough medication and interactions -of course, we advised to return or notify a doctor immediately if symptoms worsen or persist or new concerns arise.    Patient Instructions  BEFORE YOU LEAVE: -rapid flu test -Please schedule new patient visit with [place] provider given Dr. Linna Darner is retiring   INSTRUCTIONS FOR UPPER RESPIRATORY INFECTION:  -plenty of rest and fluids  -nasal saline wash 2-3 times daily (use prepackaged nasal saline or bottled/distilled water if making your own)   -can use AFRIN nasal spray for drainage and nasal congestion - but do NOT use longer then 3-4 days  -can use tylenol (in no history of liver disease) or ibuprofen (if no history of kidney disease, bowel bleeding or significant heart disease) as directed for aches and sorethroat  -in the winter time, using a humidifier at night is helpful (please follow cleaning instructions)  -if you are taking a cough medication - use only as directed, may also try a teaspoon of honey to coat the throat and throat lozenges. If given a cough medication with codeine or hydrocodone or other narcotic please be advised that this contains a strong and  potentially addicting medication. Please follow instructions carefully, take as little as possible and only use AS NEEDED for severe cough. Discuss potential side effects with your pharmacy. Please do not drive or operate machinery while taking these types of medications. Please do not take other sedating medications, drugs or alcohol while taking this  medication without discussing with your doctor.  -for sore throat, salt water gargles can help  -follow up if you have fevers, facial pain, tooth pain, difficulty breathing or are worsening or symptoms persist longer then expected  Upper Respiratory Infection, Adult An upper respiratory infection (URI) is also known as the common cold. It is often caused by a type of germ (virus). Colds are easily spread (contagious). You can pass it to others by kissing, coughing, sneezing, or drinking out of the same glass. Usually, you get better in 1 to 3  weeks.  However, the cough can last for even longer. HOME CARE   Only take medicine as told by your doctor. Follow instructions provided above.  Drink enough water and fluids to keep your pee (urine) clear or pale yellow.  Get plenty of  rest.  Return to work when your temperature is < 100 for 24 hours or as told by your doctor. You may use a face mask and wash your hands to stop your cold from spreading. GET HELP RIGHT AWAY IF:   After the first few days, you feel you are getting worse.  You have questions about your medicine.  You have chills, shortness of breath, or red spit (mucus).  You have pain in the face for more then 1-2 days, especially when you bend forward.  You have a fever, puffy (swollen) neck, pain when you swallow, or white spots in the back of your throat.  You have a bad headache, ear pain, sinus pain, or chest pain.  You have a high-pitched whistling sound when you breathe in and out (wheezing).  You cough up blood.  You have sore muscles or a stiff neck. MAKE SURE YOU:   Understand these instructions.  Will watch your condition.  Will get help right away if you are not doing well or get worse. Document Released: 04/02/2008 Document Revised: 01/07/2012 Document Reviewed: 01/20/2014 [facility] Patient Information 2015 [place], [place]. This information is not intended to replace advice given to you by your health care  provider. Make sure you discuss any questions you have with your health care provider.      Colin Benton R.

## 2015-12-05 NOTE — Progress Notes (Signed)
Pre visit review using our clinic review tool, if applicable. No additional management support is needed unless otherwise documented below in the visit note. 

## 2016-04-15 ENCOUNTER — Other Ambulatory Visit: Payer: Self-pay | Admitting: Internal Medicine

## 2016-05-20 ENCOUNTER — Other Ambulatory Visit: Payer: Self-pay | Admitting: Internal Medicine

## 2016-05-22 ENCOUNTER — Other Ambulatory Visit: Payer: Self-pay | Admitting: Internal Medicine

## 2016-05-24 ENCOUNTER — Telehealth: Payer: Self-pay | Admitting: Internal Medicine

## 2016-05-24 NOTE — Telephone Encounter (Signed)
This was completed a couple of hours ago via [email or web address]

## 2016-07-23 ENCOUNTER — Encounter: Payer: Self-pay | Admitting: *Deleted

## 2016-08-11 ENCOUNTER — Other Ambulatory Visit: Payer: Self-pay | Admitting: Internal Medicine

## 2016-08-11 DIAGNOSIS — I10 Essential (primary) hypertension: Secondary | ICD-10-CM

## 2016-08-14 ENCOUNTER — Encounter: Payer: Self-pay | Admitting: Internal Medicine

## 2016-08-14 ENCOUNTER — Encounter (INDEPENDENT_AMBULATORY_CARE_PROVIDER_SITE_OTHER): Payer: Self-pay

## 2016-08-14 ENCOUNTER — Ambulatory Visit (INDEPENDENT_AMBULATORY_CARE_PROVIDER_SITE_OTHER): Payer: Self-pay | Admitting: Internal Medicine

## 2016-08-14 VITALS — BP 100/70 | HR 72 | Ht 66.0 in | Wt 150.5 lb

## 2016-08-14 DIAGNOSIS — K581 Irritable bowel syndrome with constipation: Secondary | ICD-10-CM

## 2016-08-14 MED ORDER — LINACLOTIDE 145 MCG PO CAPS: 145.0000 ug | ORAL_CAPSULE | Freq: Every day | ORAL | 0 refills | Status: DC

## 2016-08-14 NOTE — Patient Instructions (Signed)
If you are age 62 or older, your body mass index should be between 23-30. Your Body mass index is 24.29 kg/m. If this is out of the aforementioned range listed, please consider follow up with your Primary Care Provider.  If you are age 53 or younger, your body mass index should be between 19-25. Your Body mass index is 24.29 kg/m. If this is out of the aformentioned range listed, please consider follow up with your Primary Care Provider.   We have sent medications to your pharmacy for you to pick up at your convenience.  Please follow up with Dr Hilarie Fredrickson in 1 year.  Thank you.

## 2016-08-14 NOTE — Progress Notes (Signed)
   Subjective:    Patient ID: Patricia Diaz, female    DOB: 01-Apr-1956, 60 y.o.   MRN: [id number]  HPI Patricia Diaz is a 60 year old female with a history of IBS with constipation, remote small adenoma, aortic dissection, hypertension who is here for follow-up. She was last seen on 04/01/2014. She's been taking Linzess 145 g daily with excellent control of her constipation. This improved her bloating, constipation and abdominal discomfort. She's having a bowel movement about 4-5 times per week. She denies diarrhea. No blood in her stool or melena. No abdominal pain. Denies heartburn, dysphagia or odynophagia.  Last colonoscopy 04/22/2013, good prep. Left-sided diverticulosis. No polyps. 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 She is enjoying it.  Review of Systems As per history of present illness, otherwise negative  Current Medications, Allergies, Past Medical History, Past Surgical History, Family History and Social History were reviewed in [employer] record.     Objective:   Physical Exam BP 100/70 (BP Location: Left Arm, Patient Position: Sitting, Cuff Size: Normal)   Pulse 72   Ht 5\' 6"  (1.676 m) Comment: height measured without shoes  Wt 150 lb 8 oz (68.3 kg)   BMI 24.29 kg/m  Constitutional: Well-developed and well-nourished. No distress. HEENT: Normocephalic and atraumatic.  Conjunctivae are normal.  No scleral icterus. Neck: Neck supple. Trachea midline. Cardiovascular: Normal rate, regular rhythm and intact distal pulses.  Pulmonary/chest: Effort normal and breath sounds normal. No wheezing, rales or rhonchi. Abdominal: Soft, nontender, nondistended. Bowel sounds active throughout. There are no masses palpable. No hepatosplenomegaly. Extremities: no clubbing, cyanosis, or edema Lymphadenopathy: No cervical adenopathy noted. Neurological: Alert and oriented to person place and time. Skin:  Skin is warm and dry.  Psychiatric: Normal mood and affect. Behavior is normal.  CBC    Component Value Date/Time   WBC 4.4 07/26/2015 1114   RBC 4.62 07/26/2015 1114   HGB 14.3 07/26/2015 1114   HCT 42.3 07/26/2015 1114   PLT 191.0 07/26/2015 1114   MCV 91.6 07/26/2015 1114   MCHC 33.8 07/26/2015 1114   RDW 11.9 07/26/2015 1114   LYMPHSABS 1.0 07/26/2015 1114   MONOABS 0.3 07/26/2015 1114   EOSABS 0.1 07/26/2015 1114   BASOSABS 0.0 07/26/2015 1114      Assessment & Plan:   60 year old female with a history of IBS with constipation, remote small adenoma, aortic dissection, hypertension who is here for follow-up.  1. Chronic constipation with IBS -- excellent control with Linzess and we will continue 145 g daily. Follow-up in one year, sooner if necessary  2. Remote small adenoma/CRC screening -- repeat colonoscopy on 10 year interval after normal colonoscopy in 2014. No change in family history.  15 minutes spent with the patient today. Greater than 50% was spent in counseling and coordination of care with the patient

## 2016-08-21 ENCOUNTER — Other Ambulatory Visit: Payer: Self-pay | Admitting: Internal Medicine

## 2016-08-21 DIAGNOSIS — E785 Hyperlipidemia, unspecified: Secondary | ICD-10-CM

## 2016-09-04 ENCOUNTER — Other Ambulatory Visit: Payer: Self-pay | Admitting: Internal Medicine

## 2016-09-04 DIAGNOSIS — I10 Essential (primary) hypertension: Secondary | ICD-10-CM

## 2016-09-04 DIAGNOSIS — E785 Hyperlipidemia, unspecified: Secondary | ICD-10-CM

## 2016-09-10 ENCOUNTER — Other Ambulatory Visit (INDEPENDENT_AMBULATORY_CARE_PROVIDER_SITE_OTHER): Payer: 59

## 2016-09-10 ENCOUNTER — Ambulatory Visit (INDEPENDENT_AMBULATORY_CARE_PROVIDER_SITE_OTHER): Payer: 59 | Admitting: Internal Medicine

## 2016-09-10 ENCOUNTER — Encounter: Payer: Self-pay | Admitting: Internal Medicine

## 2016-09-10 VITALS — BP 132/72 | HR 78 | Temp 98.6°F | Resp 12 | Ht 66.0 in | Wt 151.0 lb

## 2016-09-10 DIAGNOSIS — Z23 Encounter for immunization: Secondary | ICD-10-CM

## 2016-09-10 DIAGNOSIS — I10 Essential (primary) hypertension: Secondary | ICD-10-CM

## 2016-09-10 DIAGNOSIS — I71019 Dissection of thoracic aorta, unspecified: Secondary | ICD-10-CM

## 2016-09-10 DIAGNOSIS — I7101 Dissection of thoracic aorta: Secondary | ICD-10-CM | POA: Diagnosis not present

## 2016-09-10 DIAGNOSIS — Z2911 Encounter for prophylactic immunotherapy for respiratory syncytial virus (RSV): Secondary | ICD-10-CM | POA: Diagnosis not present

## 2016-09-10 DIAGNOSIS — E785 Hyperlipidemia, unspecified: Secondary | ICD-10-CM | POA: Diagnosis not present

## 2016-09-10 LAB — LIPID PANEL
CHOLESTEROL: 159 mg/dL (ref 0–200)
HDL: 72 mg/dL (ref >39.00)
LDL CALC: 78 mg/dL (ref 0–99)
NonHDL: 87.17
Total CHOL/HDL Ratio: 2
Triglycerides: 48 mg/dL (ref 0.0–149.0)
VLDL: 9.6 mg/dL (ref 0.0–40.0)

## 2016-09-10 LAB — CBC
HEMATOCRIT: 41.3 % (ref 36.0–46.0)
HEMOGLOBIN: 13.9 g/dL (ref 12.0–15.0)
MCHC: 33.6 g/dL (ref 30.0–36.0)
MCV: 91.5 fl (ref 78.0–100.0)
Platelets: 199 10*3/uL (ref 150.0–400.0)
RBC: 4.51 Mil/uL (ref 3.87–5.11)
RDW: 12.5 % (ref 11.5–15.5)
WBC: 5.6 10*3/uL (ref 4.0–10.5)

## 2016-09-10 LAB — COMPREHENSIVE METABOLIC PANEL
ALBUMIN: 4.5 g/dL (ref 3.5–5.2)
ALK PHOS: 54 U/L (ref 39–117)
ALT: 26 U/L (ref 0–35)
AST: 22 U/L (ref 0–37)
BUN: 16 mg/dL (ref 6–23)
CALCIUM: 9.7 mg/dL (ref 8.4–10.5)
CHLORIDE: 104 meq/L (ref 96–112)
CO2: 29 mEq/L (ref 19–32)
CREATININE: 0.68 mg/dL (ref 0.40–1.20)
GFR: 92.98 mL/min (ref >60.00)
Glucose, Bld: 95 mg/dL (ref 70–99)
POTASSIUM: 4 meq/L (ref 3.5–5.1)
SODIUM: 140 meq/L (ref 135–145)
Total Bilirubin: 1 mg/dL (ref 0.2–1.2)
Total Protein: 6.9 g/dL (ref 6.0–8.3)

## 2016-09-10 MED ORDER — HYDROCHLOROTHIAZIDE 12.5 MG PO CAPS: ORAL_CAPSULE | ORAL | 1 refills | Status: DC

## 2016-09-10 MED ORDER — CLONIDINE HCL 0.1 MG PO TABS: ORAL_TABLET | ORAL | 1 refills | Status: DC

## 2016-09-10 MED ORDER — LABETALOL HCL 200 MG PO TABS: ORAL_TABLET | ORAL | 1 refills | Status: DC

## 2016-09-10 MED ORDER — ATORVASTATIN CALCIUM 20 MG PO TABS: ORAL_TABLET | ORAL | 1 refills | Status: DC

## 2016-09-10 NOTE — Assessment & Plan Note (Signed)
Recent fu with [facility] and gets yearly imaging with them and last stable. Needs tight BP control.

## 2016-09-10 NOTE — Progress Notes (Signed)
   Subjective:    Patient ID: Patricia Diaz, female    DOB: 11/24/1955, 60 y.o.   MRN: [id number]  HPI The patient is 60 YO female coming in for BP recheck. She has not been over a year and needs labs. She denies having new symptoms or problems. Mild headaches due to running out of medicine. No SOB or chest pain. She denies missing doses of medication except recent. She does have hx aortic dissection and needs tighter than average BP control. No side effects from the medicine.    Review of Systems  Constitutional: Negative.   Respiratory: Negative.   Cardiovascular: Negative.   Gastrointestinal: Negative.   Musculoskeletal: Negative.   Skin: Negative.   Neurological: Positive for headaches. Negative for dizziness, speech difficulty, weakness and numbness.      Objective:   Physical Exam  Constitutional: She is oriented to person, place, and time. She appears well-developed and well-nourished.  HENT:  Head: Normocephalic and atraumatic.  Eyes: EOM are normal.  Cardiovascular: Normal rate and regular rhythm.   Pulmonary/Chest: Effort normal and breath sounds normal. No respiratory distress. She has no wheezes. She has no rales.  Abdominal: Soft.  Musculoskeletal: She exhibits no edema.  Neurological: She is alert and oriented to person, place, and time.  Skin: Skin is warm and dry.   Vitals:   09/10/16 1259  BP: 132/72  Pulse: 78  Resp: 12  Temp: 98.6 F (37 C)  TempSrc: Oral  SpO2: 98%  Weight: 151 lb (68.5 kg)  Height: 5\' 6"  (1.676 m)      Assessment & Plan:  Shingles shot given at visit.

## 2016-09-10 NOTE — Progress Notes (Signed)
Pre visit review using our clinic review tool, if applicable. No additional management support is needed unless otherwise documented below in the visit note. 

## 2016-09-10 NOTE — Patient Instructions (Addendum)
We are checking the labs today and will call you back with the results.   It is okay to cancel with Baldo [facility] and make a physical in the next 3-6 months.   We have given you the shingles shot today.

## 2016-09-10 NOTE — Assessment & Plan Note (Signed)
BP at goal and will refill hctz, clonidine, labetalol. Checking CMP and adjust as needed.

## 2016-09-11 NOTE — Assessment & Plan Note (Signed)
Checking lipid panel and adjust as needed. She is taking lipitor 20 mg daily without side effects.

## 2016-10-10 ENCOUNTER — Ambulatory Visit: Payer: Self-pay | Admitting: Nurse Practitioner

## 2016-11-23 ENCOUNTER — Other Ambulatory Visit: Payer: Self-pay | Admitting: Internal Medicine

## 2016-12-27 HISTORY — PX: CERVICAL DISC SURGERY: SHX588

## 2017-01-08 ENCOUNTER — Ambulatory Visit: Payer: Self-pay | Admitting: Internal Medicine

## 2017-01-15 ENCOUNTER — Ambulatory Visit (INDEPENDENT_AMBULATORY_CARE_PROVIDER_SITE_OTHER): Payer: Commercial Managed Care - PPO | Admitting: Internal Medicine

## 2017-01-15 ENCOUNTER — Encounter: Payer: Self-pay | Admitting: Internal Medicine

## 2017-01-15 DIAGNOSIS — H539 Unspecified visual disturbance: Secondary | ICD-10-CM

## 2017-01-15 NOTE — Patient Instructions (Signed)
We will get you in with the neurologist.  

## 2017-01-15 NOTE — Progress Notes (Signed)
Pre visit review using our clinic review tool, if applicable. No additional management support is needed unless otherwise documented below in the visit note. 

## 2017-01-15 NOTE — Progress Notes (Signed)
   Subjective:    Patient ID: Patricia Diaz, female    DOB: [birth year], 61 y.o.   MRN: [id number]  HPI The patient is a 61 YO female coming in concerned that she is having some tia symptoms. She is having some visual changes and floaters. She has had some floaters in her eyes and they are still there. She has seen eye doctor and they are unable to get her eye glasses right. She is wanting to see an ophthalmologist to assess her vision and the floaters. She had one episode where she felt like the words were floating and this lasted several minutes. She did not seek care at that time. She is not currently having symptoms. Denies numbness or weakness anywhere since that time.   Review of Systems  Constitutional: Negative.   Eyes: Positive for visual disturbance.  Respiratory: Negative.   Cardiovascular: Negative.   Gastrointestinal: Negative.   Musculoskeletal: Negative.   Skin: Negative.   Neurological: Negative for dizziness, tremors, seizures, syncope, facial asymmetry, weakness, light-headedness, numbness and headaches.  Psychiatric/Behavioral: Negative.       Objective:   Physical Exam  Constitutional: She is oriented to person, place, and time. She appears well-developed and well-nourished.  HENT:  Head: Normocephalic and atraumatic.  Right Ear: External ear normal.  Left Ear: External ear normal.  Mouth/Throat: Oropharynx is clear and moist.  Eyes: Conjunctivae and EOM are normal. Pupils are equal, round, and reactive to light.  Neck: Normal range of motion. No JVD present.  Cardiovascular: Normal rate and regular rhythm.   Pulmonary/Chest: Effort normal and breath sounds normal. No respiratory distress. She has no wheezes. She has no rales.  Abdominal: Soft.  Musculoskeletal: She exhibits no edema.  Lymphadenopathy:    She has no cervical adenopathy.  Neurological: She is alert and oriented to person, place, and time.  Skin: Skin is warm and dry.   Vitals:   01/15/17  1328  BP: 124/60  Pulse: 66  Temp: 98.1 F (36.7 C)  TempSrc: Oral  SpO2: 99%  Weight: 151 lb (68.5 kg)  Height: 5\' 6"  (1.676 m)      Assessment & Plan:

## 2017-01-15 NOTE — Assessment & Plan Note (Signed)
It is unclear that these episodes represent TIA however she is wanting to see a neurologist. Advised to keep appointment with eye specialist. She is taking low dose aspirin and advised to continue daily in the meantime.

## 2017-01-16 ENCOUNTER — Encounter: Payer: Self-pay | Admitting: Internal Medicine

## 2017-01-21 ENCOUNTER — Encounter: Payer: Self-pay | Admitting: Neurology

## 2017-01-21 ENCOUNTER — Telehealth: Payer: Self-pay | Admitting: Internal Medicine

## 2017-01-21 NOTE — Telephone Encounter (Signed)
Pt states that her linzess is not helping anymore and wants to know if she can take a higher dose. Pt is currently taking 181mcg daily. Please advise.

## 2017-01-22 MED ORDER — LINACLOTIDE 290 MCG PO CAPS: 290.0000 ug | ORAL_CAPSULE | Freq: Every day | ORAL | 3 refills | Status: DC

## 2017-01-22 NOTE — Telephone Encounter (Signed)
Can increase to 290 mcg dose and see if this is more effective for constipation

## 2017-01-22 NOTE — Telephone Encounter (Signed)
Spoke with pt and script sent to pharmacy. 

## 2017-01-30 ENCOUNTER — Telehealth: Payer: Self-pay | Admitting: Internal Medicine

## 2017-01-30 DIAGNOSIS — M79642 Pain in left hand: Principal | ICD-10-CM

## 2017-01-30 DIAGNOSIS — M79641 Pain in right hand: Secondary | ICD-10-CM

## 2017-01-30 NOTE — Telephone Encounter (Signed)
Pt called in and would like a referral to a dr to help her with her Arthritis.   She said that It is really hurting her and needs help

## 2017-01-31 NOTE — Telephone Encounter (Signed)
Patient stating she is having it in both of her hands

## 2017-01-31 NOTE — Telephone Encounter (Signed)
Where is she having the arthritis?

## 2017-02-01 NOTE — Telephone Encounter (Signed)
Patient aware.

## 2017-02-01 NOTE — Telephone Encounter (Signed)
Referral placed to sports medicine

## 2017-02-18 ENCOUNTER — Encounter (INDEPENDENT_AMBULATORY_CARE_PROVIDER_SITE_OTHER): Payer: Self-pay | Admitting: Orthopaedic Surgery

## 2017-02-18 ENCOUNTER — Ambulatory Visit (INDEPENDENT_AMBULATORY_CARE_PROVIDER_SITE_OTHER): Payer: Commercial Managed Care - PPO

## 2017-02-18 ENCOUNTER — Ambulatory Visit (INDEPENDENT_AMBULATORY_CARE_PROVIDER_SITE_OTHER): Payer: Commercial Managed Care - PPO | Admitting: Orthopaedic Surgery

## 2017-02-18 DIAGNOSIS — M7711 Lateral epicondylitis, right elbow: Secondary | ICD-10-CM | POA: Diagnosis not present

## 2017-02-18 DIAGNOSIS — M19041 Primary osteoarthritis, right hand: Secondary | ICD-10-CM

## 2017-02-18 DIAGNOSIS — M19042 Primary osteoarthritis, left hand: Secondary | ICD-10-CM

## 2017-02-18 MED ORDER — BUPIVACAINE HCL 0.5 % IJ SOLN
1.0000 mL | INTRAMUSCULAR | Status: AC | PRN
  Administered 2017-02-18: 1 mL

## 2017-02-18 MED ORDER — METHYLPREDNISOLONE ACETATE 40 MG/ML IJ SUSP
40.0000 mg | INTRAMUSCULAR | Status: AC | PRN
  Administered 2017-02-18: 40 mg

## 2017-02-18 MED ORDER — LIDOCAINE HCL 1 % IJ SOLN
1.0000 mL | INTRAMUSCULAR | Status: AC | PRN
  Administered 2017-02-18: 1 mL

## 2017-02-18 MED ORDER — CELECOXIB 200 MG PO CAPS
200.0000 mg | ORAL_CAPSULE | Freq: Two times a day (BID) | ORAL | 3 refills | Status: DC
Start: 2017-02-18 — End: 2017-03-27

## 2017-02-18 NOTE — Progress Notes (Addendum)
Office Visit Note   Patient: Patricia Diaz           Date of Birth: [birth year]           MRN: [id number] Visit Date: 02/18/2017              Requested by: Hoyt Koch, MD [place], [place] [id number] PCP: Hoyt Koch, MD   Assessment & Plan: Visit Diagnoses:  1. Osteoarthritis of fingers of hands, bilateral   2. Lateral epicondylitis, right elbow     Plan: Tennis elbow injection was performed today under sterile conditions. She wants to hold on physical therapy for now. I gave her prescription for Celebrex and a sample PENNSAID for her hand arthritis. I'll see her back as needed. Total face to face encounter time was greater than 45 minutes and over half of this time was spent in counseling and/or coordination of care.  Follow-Up Instructions: Return if symptoms worsen or fail to improve.   Orders:  Orders Placed This Encounter  Procedures  . XR Hand Complete Right  . XR Hand Complete Left   Meds ordered this encounter  Medications  . celecoxib (CELEBREX) 200 MG capsule    Sig: Take 1 capsule (200 mg total) by mouth 2 (two) times daily.    Dispense:  30 capsule    Refill:  3      Procedures: Hand/UE Inj Date/Time: 02/18/2017 4:33 PM Performed by: Leandrew Koyanagi Authorized by: Leandrew Koyanagi   Consent Given by:  Patient Timeout: prior to procedure the correct patient, procedure, and site was verified   Indications:  Pain Condition: lateral epicondylitis   Site:  R elbow Prep: patient was prepped and draped in usual sterile fashion   Needle Size:  25 G Medications:  1 mL lidocaine 1 %; 1 mL bupivacaine 0.5 %; 40 mg methylPREDNISolone acetate 40 MG/ML     Clinical Data: No additional findings.   Subjective: Chief Complaint  Patient presents with  . Left Hand - Pain  . Right Hand - Pain    Patient is a 61 year old female who complains of bilateral hand pain and right elbow pain. She has significant use of her hands. She  is swollen PIP and DIP joints. She is also complaining of right elbow pain is worse with use of the right hand. The pain is associated with tingling burning and numbness. This does wake her up at night. She's tried over-the-counter rubs without any relief. She is taking Advil and Aleve with no relief. Denies any injuries. She does have a strong family history of osteoarthritis.    Review of Systems  Constitutional: Negative.   HENT: Negative.   Eyes: Negative.   Respiratory: Negative.   Cardiovascular: Negative.   Endocrine: Negative.   Musculoskeletal: Negative.   Neurological: Negative.   Hematological: Negative.   Psychiatric/Behavioral: Negative.   All other systems reviewed and are negative.    Objective: Vital Signs: There were no vitals taken for this visit.  Physical Exam  Constitutional: She is oriented to person, place, and time. She appears well-developed and well-nourished.  HENT:  Head: Normocephalic and atraumatic.  Eyes: EOM are normal.  Neck: Neck supple.  Pulmonary/Chest: Effort normal.  Abdominal: Soft.  Neurological: She is alert and oriented to person, place, and time.  Skin: Skin is warm. Capillary refill takes less than 2 seconds.  Psychiatric: She has a normal mood and affect. Her behavior is normal. Judgment and thought content normal.  Nursing note and vitals reviewed.   Ortho Exam Bilateral hand exam shows classic Heberden and Bouchard nodes worse on the right hand. It is no triggering. There is no skin changes. Right elbow exam shows tenderness over the lateral epicondyle. Pain with resisted ECRB extension. Radial tunnel is nontender. Specialty Comments:  No specialty comments available.  Imaging: Xr Hand Complete Left  Result Date: 02/18/2017 OA of multiple PIP and DIP joints  Xr Hand Complete Right  Result Date: 02/18/2017 OA of multiple PIP and DIP joints    PMFS History: Patient Active Problem List   Diagnosis Date Noted  .  Osteoarthritis of fingers of hands, bilateral 02/18/2017  . Lateral epicondylitis, right elbow 02/18/2017  . Vision changes 01/15/2017  . Aortic insufficiency 08/23/2015  . ADHD (attention deficit hyperactivity disorder) 06/11/2014  . Vitamin D deficiency 07/28/2012  . Hyperlipidemia 07/28/2012  . Essential hypertension 03/24/2010  . TRANSIENT ISCHEMIC ATTACK 03/24/2010  . CONSTIPATION 12/14/2009  . ARTHRALGIA 05/23/2009  . Chronic thoracic aortic dissection (HCC) 07/22/2008  . GERD 07/04/2008  . DEPRESSION 04/09/2007   Past Medical History:  Diagnosis Date  . Adenomatous colon polyp   . Aortic dissection ([place])    Dr Nicola Girt, [place]  . Constipation, chronic    Linzess  . Hx of migraines    Topamax prophylaxis  . Hyperlipidemia   . Hypertension   . IBS (irritable bowel syndrome)     Family History  Problem Relation Age of Onset  . Heart attack Father     in 87s  . Hypertension Father   . Transient ischemic attack Mother     mini strokes  . Hypertension Mother   . Diabetes Neg Hx   . Cancer Neg Hx   . Colon cancer Neg Hx   . Liver disease Neg Hx   . Kidney disease Neg Hx     Past Surgical History:  Procedure Laterality Date  . ABDOMINAL HYSTERECTOMY     & USO for fibroids  . COLONOSCOPY  2014  . FOOT SURGERY Bilateral   . LUMBAR FUSION     Dr Hal Neer, [place]  . TONSILLECTOMY AND ADENOIDECTOMY     Social History   Occupational History  . Not on file.   Social History Main Topics  . Smoking status: Never Smoker  . Smokeless tobacco: Never Used  . Alcohol use Yes     Comment: rarely  . Drug use: No  . Sexual activity: Not on file

## 2017-03-01 ENCOUNTER — Other Ambulatory Visit: Payer: Self-pay | Admitting: Internal Medicine

## 2017-03-01 DIAGNOSIS — I10 Essential (primary) hypertension: Secondary | ICD-10-CM

## 2017-03-27 ENCOUNTER — Encounter: Payer: Self-pay | Admitting: Neurology

## 2017-03-27 ENCOUNTER — Ambulatory Visit (INDEPENDENT_AMBULATORY_CARE_PROVIDER_SITE_OTHER): Payer: Commercial Managed Care - PPO | Admitting: Neurology

## 2017-03-27 VITALS — BP 100/70 | HR 68 | Ht 66.0 in | Wt 146.0 lb

## 2017-03-27 DIAGNOSIS — H539 Unspecified visual disturbance: Secondary | ICD-10-CM

## 2017-03-27 DIAGNOSIS — R4789 Other speech disturbances: Secondary | ICD-10-CM

## 2017-03-27 DIAGNOSIS — R413 Other amnesia: Secondary | ICD-10-CM

## 2017-03-27 NOTE — Patient Instructions (Signed)
1.  We will check an MRI of the brain without contrast 2.  We will refer you for neurocognitive testing (performed in our office) 3.  Recommend Mediterranean diet  Mediterranean Diet A Mediterranean diet refers to food and lifestyle choices that are based on the traditions of countries located on the [employer]. This way of eating has been shown to help prevent certain conditions and improve outcomes for people who have chronic diseases, like kidney disease and heart disease. What are tips for following this plan? Lifestyle   Cook and eat meals together with your family, when possible.  Drink enough fluid to keep your urine clear or pale yellow.  Be physically active every day. This includes:  Aerobic exercise like running or swimming.  Leisure activities like gardening, walking, or housework.  Get 7-8 hours of sleep each night.  If recommended by your health care provider, drink red wine in moderation. This means 1 glass a day for nonpregnant women and 2 glasses a day for men. A glass of wine equals 5 oz (150 mL). Reading food labels   Check the serving size of packaged foods. For foods such as rice and pasta, the serving size refers to the amount of cooked product, not dry.  Check the total fat in packaged foods. Avoid foods that have saturated fat or trans fats.  Check the ingredients list for added sugars, such as corn syrup. Shopping   At the grocery store, buy most of your food from the areas near the walls of the store. This includes:  Fresh fruits and vegetables (produce).  Grains, beans, nuts, and seeds. Some of these may be available in unpackaged forms or large amounts (in bulk).  Fresh seafood.  Poultry and eggs.  Low-fat dairy products.  Buy whole ingredients instead of prepackaged foods.  Buy fresh fruits and vegetables in-season from local farmers markets.  Buy frozen fruits and vegetables in resealable bags.  If you do not have access to quality  fresh seafood, buy precooked frozen shrimp or canned fish, such as tuna, salmon, or sardines.  Buy small amounts of raw or cooked vegetables, salads, or olives from the deli or salad bar at your store.  Stock your pantry so you always have certain foods on hand, such as olive oil, canned tuna, canned tomatoes, rice, pasta, and beans. Cooking   Cook foods with extra-virgin olive oil instead of using butter or other vegetable oils.  Have meat as a side dish, and have vegetables or grains as your main dish. This means having meat in small portions or adding small amounts of meat to foods like pasta or stew.  Use beans or vegetables instead of meat in common dishes like chili or lasagna.  Experiment with different cooking methods. Try roasting or broiling vegetables instead of steaming or sauteing them.  Add frozen vegetables to soups, stews, pasta, or rice.  Add nuts or seeds for added healthy fat at each meal. You can add these to yogurt, salads, or vegetable dishes.  Marinate fish or vegetables using olive oil, lemon juice, garlic, and fresh herbs. Meal planning   Plan to eat 1 vegetarian meal one day each week. Try to work up to 2 vegetarian meals, if possible.  Eat seafood 2 or more times a week.  Have healthy snacks readily available, such as:  Vegetable sticks with hummus.  Greek yogurt.  Fruit and nut trail mix.  Eat balanced meals throughout the week. This includes:  Fruit: 2-3 servings a  day  Vegetables: 4-5 servings a day  Low-fat dairy: 2 servings a day  Fish, poultry, or lean meat: 1 serving a day  Beans and legumes: 2 or more servings a week  Nuts and seeds: 1-2 servings a day  Whole grains: 6-8 servings a day  Extra-virgin olive oil: 3-4 servings a day  Limit red meat and sweets to only a few servings a month What are my food choices?  Mediterranean diet  Recommended  Grains: Whole-grain pasta. Brown rice. Bulgar wheat. Polenta. Couscous.  Whole-wheat bread. [place].  Vegetables: Artichokes. Beets. Broccoli. Cabbage. Carrots. Eggplant. Green beans. Chard. Kale. Spinach. Onions. Leeks. Peas. Squash. Tomatoes. Peppers. Radishes.  Fruits: Apples. Apricots. Avocado. Berries. Bananas. Cherries. Dates. Figs. Grapes. Lemons. Melon. Oranges. Peaches. Plums. Pomegranate.  Meats and other protein foods: Beans. Almonds. Sunflower seeds. Pine nuts. Peanuts. [place]. Salmon. Scallops. Shrimp. [place]. Tilapia. Clams. Oysters. Eggs.  Dairy: Low-fat milk. Cheese. Greek yogurt.  Beverages: Water. Red wine. Herbal tea.  Fats and oils: Extra virgin olive oil. Avocado oil. Grape seed oil.  Sweets and desserts: [place] yogurt with honey. Baked apples. Poached pears. Trail mix.  Seasoning and other foods: Basil. Cilantro. Coriander. Cumin. Mint. Parsley. Sage. Rosemary. Tarragon. Garlic. Oregano. Thyme. Pepper. Balsalmic vinegar. Tahini. Hummus. Tomato sauce. Olives. Mushrooms.  Limit these  Grains: Prepackaged pasta or rice dishes. Prepackaged cereal with added sugar.  Vegetables: Deep fried potatoes (french fries).  Fruits: Fruit canned in syrup.  Meats and other protein foods: Beef. Pork. Lamb. Poultry with skin. Hot dogs. Berniece Salines.  Dairy: Ice cream. Sour cream. Whole milk.  Beverages: Juice. Sugar-sweetened soft drinks. Beer. Liquor and spirits.  Fats and oils: Butter. Canola oil. Vegetable oil. Beef fat (tallow). Lard.  Sweets and desserts: Cookies. Cakes. Pies. Candy.  Seasoning and other foods: Mayonnaise. Premade sauces and marinades.  The items listed may not be a complete list. Talk with your dietitian about what dietary choices are right for you. Summary  The Mediterranean diet includes both food and lifestyle choices.  Eat a variety of fresh fruits and vegetables, beans, nuts, seeds, and whole grains.  Limit the amount of red meat and sweets that you eat.  Talk with your health care provider about whether it is safe  for you to drink red wine in moderation. This means 1 glass a day for nonpregnant women and 2 glasses a day for men. A glass of wine equals 5 oz (150 mL). This information is not intended to replace advice given to you by your health care provider. Make sure you discuss any questions you have with your health care provider. Document Released: 06/07/2016 Document Revised: 07/10/2016 Document Reviewed: 06/07/2016 Elsevier Interactive Patient Education  2017 [employer].

## 2017-03-27 NOTE — Progress Notes (Signed)
NEUROLOGY CONSULTATION NOTE  Patricia Diaz MRN: [id number] DOB: July 17, 1956  Referring provider: Dr. Sharlet [place] Primary care provider: Dr. Sharlet [place]  Reason for consult:  Visual disturbance, memory loss  HISTORY OF PRESENT ILLNESS: Patricia Diaz is a 61 year old right-handed female who presents for visual changes and memory loss.  History supplemented by PCP note.    For two years, she has had episodic visual disturbance.  She will sit and read something on her computer and suddenly she couldn't see half of the sentence or word.  There are no associated headaches.  There isn't any clear field cut or positive phenomena such as shapes or colors.  It would last about 10 minutes. They occur about once or twice a week.  She has seen opthalmology, who told her that she has cataract, but nothing else.  For the past 6 months, she reports memory and word-finding issues.  She will have trouble coming up with common words that she uses for years.  She also has trouble remembering names of familiar people.  One time, she got disoriented while driving on a familiar route.  She needed to pull over and think in order to reorient herself.  She is concerned, because her mother had TIAs and dementia.  She also reports problems with balance.  She feels unsteady on her feet.  She has history of migraines.  She has brief stabbing headaches in the right parietal/occipital region, lasting only a minute.  They occur 5 days out of the week.  In the past, she has taken topiramate and Effexor, both have caused side effects.  Labs from 09/10/16:  CBC with WBC 6.6, HGB 13.9, HCT 41.3, PLT 199; CMP with Na 140, K 4, Cl 104, CO2 29, glucose 95, BUN 16, Cr 0.68, total bili 1, ALP 54, AST 22, ALT 26.  PAST MEDICAL HISTORY: Past Medical History:  Diagnosis Date  . Adenomatous colon polyp   . Aortic dissection ([place])    Dr Nicola Girt, [place]  . Constipation, chronic    Linzess  . Hx of migraines    Topamax  prophylaxis  . Hyperlipidemia   . Hypertension   . IBS (irritable bowel syndrome)     PAST SURGICAL HISTORY: Past Surgical History:  Procedure Laterality Date  . ABDOMINAL HYSTERECTOMY     & USO for fibroids  . COLONOSCOPY  2014  . FOOT SURGERY Bilateral   . LUMBAR FUSION     Dr Hal Neer, [place]  . TONSILLECTOMY AND ADENOIDECTOMY      MEDICATIONS: Current Outpatient Prescriptions on File Prior to Visit  Medication Sig Dispense Refill  . amphetamine-dextroamphetamine (ADDERALL) 20 MG tablet Take 20 mg by mouth daily.    Marland Kitchen aspirin 81 MG tablet Take 81 mg by mouth daily.     Marland Kitchen atorvastatin (LIPITOR) 20 MG tablet TAKE 1 TABLET BY MOUTH EVERY DAY 90 tablet 1  . clonazePAM (KLONOPIN) 1 MG tablet Take 1 mg by mouth as needed. Reported on 12/05/2015    . cloNIDine (CATAPRES) 0.1 MG tablet TAKE 1 TABLET BY MOUTH TWICE A DAY 180 tablet 1  . hydrochlorothiazide (MICROZIDE) 12.5 MG capsule TAKE ONE CAPSULE BY MOUTH EVERY DAY 90 capsule 1  . labetalol (NORMODYNE) 200 MG tablet TAKE 1 TABLET (200 MG TOTAL) BY MOUTH 3 (THREE) TIMES DAILY. 270 tablet 1  . linaclotide (LINZESS) 290 MCG CAPS capsule Take 1 capsule (290 mcg total) by mouth daily before breakfast. 30 capsule 3   No current facility-administered medications on file  prior to visit.     ALLERGIES: Allergies  Allergen Reactions  . Hydrocodone-Acetaminophen Nausea Only  . Spironolactone     ? dizziness  This had been prescribed in place of HCTZ because of hypokalemia    FAMILY HISTORY: Family History  Problem Relation Age of Onset  . Heart attack Father        in 40s  . Hypertension Father   . Transient ischemic attack Mother        mini strokes  . Hypertension Mother   . Diabetes Neg Hx   . Cancer Neg Hx   . Colon cancer Neg Hx   . Liver disease Neg Hx   . Kidney disease Neg Hx     SOCIAL HISTORY: Social History   Social History  . Marital status: Married    Spouse name: N/A  . Number of children: 1  . Years of  education: N/A   Occupational History  . Not on file.   Social History Main Topics  . Smoking status: Never Smoker  . Smokeless tobacco: Never Used  . Alcohol use Yes     Comment: couple times a month  . Drug use: No  . Sexual activity: Not on file   Other Topics Concern  . Not on file   Social History Narrative  . No narrative on file    REVIEW OF SYSTEMS: Constitutional: No fevers, chills, or sweats, no generalized fatigue, change in appetite Eyes: No visual changes, double vision, eye pain Ear, nose and throat: No hearing loss, ear pain, nasal congestion, sore throat Cardiovascular: No chest pain, palpitations Respiratory:  No shortness of breath at rest or with exertion, wheezes GastrointestinaI: No nausea, vomiting, diarrhea, abdominal pain, fecal incontinence Genitourinary:  No dysuria, urinary retention or frequency Musculoskeletal:  No neck pain, back pain Integumentary: No rash, pruritus, skin lesions Neurological: as above Psychiatric: No depression, insomnia, anxiety Endocrine: No palpitations, fatigue, diaphoresis, mood swings, change in appetite, change in weight, increased thirst Hematologic/Lymphatic:  No purpura, petechiae. Allergic/Immunologic: no itchy/runny eyes, nasal congestion, recent allergic reactions, rashes  PHYSICAL EXAM: Vitals:   03/27/17 0820  BP: 100/70  Pulse: 68   General: No acute distress.  Tearful.  Patient appears well-groomed.  Head:  Normocephalic/atraumatic Eyes:  fundi examined but not visualized Neck: supple, no paraspinal tenderness, full range of motion Back: No paraspinal tenderness Heart: regular rate and rhythm Lungs: Clear to auscultation bilaterally. Vascular: No carotid bruits. Neurological Exam: Mental status: alert and oriented to person, place, and time, delayed recall 2/3 words, remote memory intact, fund of knowledge intact, attention and concentration decreased, speech fluent and not dysarthric, language  intact. MMSE - Mini Mental State Exam 03/27/2017  Orientation to time 5  Orientation to Place 5  Registration 3  Attention/ Calculation 2  Recall 2  Language- name 2 objects 2  Language- repeat 1  Language- follow 3 step command 3  Language- read & follow direction 1  Write a sentence 1  Copy design 1  Total score 26    Cranial nerves: CN I: not tested CN II: pupils equal, round and reactive to light, visual fields intact CN III, IV, VI:  full range of motion, no nystagmus, no ptosis CN V: facial sensation intact CN VII: upper and lower face symmetric CN VIII: hearing intact CN IX, X: gag intact, uvula midline CN XI: sternocleidomastoid and trapezius muscles intact CN XII: tongue midline Bulk & Tone: normal, no fasciculations. Motor:  5/5 throughout  Sensation: temperature  and vibration sensation intact. Deep Tendon Reflexes:  2+ throughout, toes downgoing.  Finger to nose testing:  Without dysmetria.  Heel to shin:  Without dysmetria.  Gait:  Normal station and stride.  Able to turn and tandem walk. Romberg negative.  IMPRESSION: Recurrent visual disturbance Memory loss and word-finding difficulties.  Recurrent visual disturbance is unclear.  Ocular migraine is possible, but that would be a diagnosis of exclusion.  Cognitive problems may be related to anxiety but further evaluation is certainly warranted.  PLAN: 1.  MRI of brain without contrast 2.  Neuropsychological testing to rule out anxiety vs cognitive impairment. 3.  Further recommendations pending results. 4.  As she is worried about dementia (given her mother's history), I recommended Mediterranean diet, which may be a preventative for Alzheimer's.  Thank you for allowing me to take part in the care of this patient.  Metta Clines, DO  CC:  Pricilla Holm, MD

## 2017-03-31 ENCOUNTER — Other Ambulatory Visit: Payer: Self-pay | Admitting: Internal Medicine

## 2017-03-31 DIAGNOSIS — E785 Hyperlipidemia, unspecified: Secondary | ICD-10-CM

## 2017-04-03 ENCOUNTER — Other Ambulatory Visit: Payer: Self-pay | Admitting: Internal Medicine

## 2017-04-03 DIAGNOSIS — I10 Essential (primary) hypertension: Secondary | ICD-10-CM

## 2017-04-05 ENCOUNTER — Telehealth: Payer: Self-pay | Admitting: Neurology

## 2017-04-05 NOTE — Telephone Encounter (Signed)
PT said she is supposed to have an MRI and has not heard anything about an appointment

## 2017-04-08 NOTE — Telephone Encounter (Signed)
Called [facility] and they will call patient this morning.

## 2017-04-12 ENCOUNTER — Encounter: Payer: Self-pay | Admitting: Internal Medicine

## 2017-04-12 ENCOUNTER — Ambulatory Visit (INDEPENDENT_AMBULATORY_CARE_PROVIDER_SITE_OTHER): Payer: Commercial Managed Care - PPO | Admitting: Internal Medicine

## 2017-04-12 VITALS — BP 114/80 | HR 71 | Temp 98.7°F | Resp 16 | Wt 147.0 lb

## 2017-04-12 DIAGNOSIS — J209 Acute bronchitis, unspecified: Secondary | ICD-10-CM

## 2017-04-12 MED ORDER — HYDROCOD POLST-CPM POLST ER 10-8 MG/5ML PO SUER: 5.0000 mL | Freq: Two times a day (BID) | ORAL | 0 refills | Status: DC | PRN

## 2017-04-12 MED ORDER — AMOXICILLIN-POT CLAVULANATE 875-125 MG PO TABS: 1.0000 | ORAL_TABLET | Freq: Two times a day (BID) | ORAL | 0 refills | Status: DC

## 2017-04-12 NOTE — Assessment & Plan Note (Signed)
Likely viral Continue advil, otc cold meds tussionex for cough augmentin written script given - she will take only if symptoms worsen/do not improve - advised it is best to avoid it if possible Rest, fluids  Call if no improvement

## 2017-04-12 NOTE — Progress Notes (Signed)
Subjective:    Patient ID: Patricia Diaz, female    DOB: 1955/12/18, 61 y.o.   MRN: [id number]  HPI She is here for an acute visit for cold symptoms.   Her symptoms started 3 days ago.  She is experiencing productive cough with yellow phlegm, chest pain with coughing, nasal congestion, sore throat.  She denies fever, sob and wheeze.    She has tried taking advil and delsym with some improvement in symptoms. They have not helped her cough.    Medications and allergies reviewed with patient and updated if appropriate.  Patient Active Problem List   Diagnosis Date Noted  . Osteoarthritis of fingers of hands, bilateral 02/18/2017  . Lateral epicondylitis, right elbow 02/18/2017  . Vision changes 01/15/2017  . Aortic insufficiency 08/23/2015  . ADHD (attention deficit hyperactivity disorder) 06/11/2014  . Vitamin D deficiency 07/28/2012  . Hyperlipidemia 07/28/2012  . Essential hypertension 03/24/2010  . TRANSIENT ISCHEMIC ATTACK 03/24/2010  . CONSTIPATION 12/14/2009  . ARTHRALGIA 05/23/2009  . Chronic thoracic aortic dissection (HCC) 07/22/2008  . GERD 07/04/2008  . DEPRESSION 04/09/2007    Current Outpatient Prescriptions on File Prior to Visit  Medication Sig Dispense Refill  . amphetamine-dextroamphetamine (ADDERALL) 20 MG tablet Take 20 mg by mouth daily.    Marland Kitchen aspirin 81 MG tablet Take 81 mg by mouth daily.     Marland Kitchen atorvastatin (LIPITOR) 20 MG tablet TAKE 1 TABLET BY MOUTH EVERY DAY 90 tablet 1  . busPIRone (BUSPAR) 30 MG tablet Take 30 mg by mouth 2 (two) times daily.    . clonazePAM (KLONOPIN) 1 MG tablet Take 1 mg by mouth as needed. Reported on 12/05/2015    . cloNIDine (CATAPRES) 0.1 MG tablet TAKE 1 TABLET BY MOUTH TWICE A DAY 180 tablet 1  . hydrochlorothiazide (MICROZIDE) 12.5 MG capsule TAKE ONE CAPSULE BY MOUTH EVERY DAY 90 capsule 1  . labetalol (NORMODYNE) 200 MG tablet TAKE 1 TABLET (200 MG TOTAL) BY MOUTH 3 (THREE) TIMES DAILY. 270 tablet 1  . linaclotide  (LINZESS) 290 MCG CAPS capsule Take 1 capsule (290 mcg total) by mouth daily before breakfast. 30 capsule 3   No current facility-administered medications on file prior to visit.     Past Medical History:  Diagnosis Date  . Adenomatous colon polyp   . Aortic dissection ([place])    Dr Nicola Girt, [place]  . Constipation, chronic    Linzess  . Hx of migraines    Topamax prophylaxis  . Hyperlipidemia   . Hypertension   . IBS (irritable bowel syndrome)     Past Surgical History:  Procedure Laterality Date  . ABDOMINAL HYSTERECTOMY     & USO for fibroids  . COLONOSCOPY  2014  . FOOT SURGERY Bilateral   . LUMBAR FUSION     Dr Hal Neer, [place]  . TONSILLECTOMY AND ADENOIDECTOMY      Social History   Social History  . Marital status: Married    Spouse name: N/A  . Number of children: 1  . Years of education: N/A   Social History Main Topics  . Smoking status: Never Smoker  . Smokeless tobacco: Never Used  . Alcohol use Yes     Comment: couple times a month  . Drug use: No  . Sexual activity: Not on file   Other Topics Concern  . Not on file   Social History Narrative  . No narrative on file    Family History  Problem Relation Age of Onset  .  Heart attack Father        in 74s  . Hypertension Father   . Transient ischemic attack Mother        mini strokes  . Hypertension Mother   . Diabetes Neg Hx   . Cancer Neg Hx   . Colon cancer Neg Hx   . Liver disease Neg Hx   . Kidney disease Neg Hx     Review of Systems  Constitutional: Negative for chills and fever.  HENT: Positive for congestion, rhinorrhea and sore throat. Negative for ear pain, sinus pain and sinus pressure.   Respiratory: Positive for cough (yellow phlegm). Negative for shortness of breath and wheezing.   Gastrointestinal: Negative for nausea.  Musculoskeletal: Negative for myalgias.  Neurological: Negative for dizziness, light-headedness and headaches.       Objective:   Vitals:   04/12/17 1435    BP: 114/80  Pulse: 71  Resp: 16  Temp: 98.7 F (37.1 C)   Filed Weights   04/12/17 1435  Weight: 147 lb (66.7 kg)   Body mass index is 23.73 kg/m.  Wt Readings from Last 3 Encounters:  04/12/17 147 lb (66.7 kg)  03/27/17 146 lb (66.2 kg)  01/15/17 151 lb (68.5 kg)     Physical Exam GENERAL APPEARANCE: Appears stated age, well appearing, NAD EYES: conjunctiva clear, no icterus HEENT: bilateral tympanic membranes and ear canals normal, oropharynx with mild erythema, no thyromegaly, trachea midline, no cervical or supraclavicular lymphadenopathy LUNGS: Clear to auscultation without wheeze or crackles, unlabored breathing, good air entry bilaterally HEART: Normal S1,S2 without murmurs EXTREMITIES: Without clubbing, cyanosis, or edema        Assessment & Plan:   See Problem List for Assessment and Plan of chronic medical problems.

## 2017-04-12 NOTE — Patient Instructions (Signed)
This is likely viral.   You were given a prescription for tussionex cough syrup.  You were also given a prescription for an antibiotic - take this in a couple of days only if you are not feeling better or feeling worse.  It is best to avoid the antibiotic if you do not truly need it.    If your symptoms worsen or fail to improve, please contact our office for further instruction, or in case of emergency go directly to the emergency room at the closest medical facility.   General Recommendations:    Please drink plenty of fluids.  Get plenty of rest   Sleep in humidified air  Use saline nasal sprays  Netti pot  OTC Medications:  Decongestants - helps relieve congestion   Flonase (generic fluticasone) or Nasacort (generic triamcinolone) - please make sure to use the "cross-over" technique at a 45 degree angle towards the opposite eye as opposed to straight up the nasal passageway.   Sudafed (generic pseudoephedrine - Note this is the one that is available behind the pharmacy counter); Products with phenylephrine (-PE) may also be used but is often not as effective as pseudoephedrine.   If you have HIGH BLOOD PRESSURE - Coricidin HBP; AVOID any product that is -D as this contains pseudoephedrine which may increase your blood pressure.  Afrin (oxymetazoline) every 6-8 hours for up to 3 days.  Allergies - helps relieve runny nose, itchy eyes and sneezing   Claritin (generic loratidine), Allegra (fexofenidine), or Zyrtec (generic cyrterizine) for runny nose. These medications should not cause drowsiness.  Note - Benadryl (generic diphenhydramine) may be used however may cause drowsiness  Cough -   Delsym or Robitussin (generic dextromethorphan)  Expectorants - helps loosen mucus to ease removal   Mucinex (generic guaifenesin) as directed on the package.  Headaches / General Aches   Tylenol (generic acetaminophen) - DO NOT EXCEED 3 grams (3,000 mg) in a 24  hour time period  Advil/Motrin (generic ibuprofen)  Sore Throat -   Salt water gargle   Chloraseptic (generic benzocaine) spray or lozenges / Sucrets (generic dyclonine)

## 2017-04-15 ENCOUNTER — Ambulatory Visit
Admission: RE | Admit: 2017-04-15 | Discharge: 2017-04-15 | Disposition: A | Discharge status: Home / Self Care | Payer: Commercial Managed Care - PPO | Source: Ambulatory Visit | Attending: Neurology | Admitting: Neurology

## 2017-04-15 DIAGNOSIS — R413 Other amnesia: Secondary | ICD-10-CM

## 2017-04-15 DIAGNOSIS — R4789 Other speech disturbances: Secondary | ICD-10-CM

## 2017-04-15 DIAGNOSIS — H539 Unspecified visual disturbance: Secondary | ICD-10-CM

## 2017-04-16 ENCOUNTER — Telehealth: Payer: Self-pay | Admitting: Neurology

## 2017-04-16 ENCOUNTER — Telehealth: Payer: Self-pay | Admitting: Internal Medicine

## 2017-04-16 MED ORDER — HYDROCOD POLST-CPM POLST ER 10-8 MG/5ML PO SUER: 5.0000 mL | Freq: Two times a day (BID) | ORAL | 0 refills | Status: DC | PRN

## 2017-04-16 NOTE — Telephone Encounter (Signed)
Report mailed.  

## 2017-04-16 NOTE — Telephone Encounter (Signed)
-----   Message from Pieter Partridge, DO sent at 04/16/2017  7:16 AM EDT ----- MRI is normal

## 2017-04-16 NOTE — Telephone Encounter (Signed)
PT left a voicemail message asking if a report not the disc of her MRI could be mailed to her

## 2017-04-16 NOTE — Telephone Encounter (Signed)
Spoke with pt to inform. She or her husband will come pick the rx up.

## 2017-04-16 NOTE — Telephone Encounter (Signed)
Pt called in and said that she is not feeling any better.  She seen Dr burns the other day and she would like to know if Dr Quay Burow could refill the cough med?

## 2017-04-16 NOTE — Telephone Encounter (Signed)
Patient made aware.

## 2017-04-16 NOTE — Telephone Encounter (Signed)
rx for cough syrup printed

## 2017-04-26 ENCOUNTER — Telehealth: Payer: Self-pay | Admitting: *Deleted

## 2017-04-26 NOTE — Telephone Encounter (Signed)
Status:  Final result Visible to patient:  No (Not Released) Dx:  Memory loss; Visual disturbance; Word...  Notes recorded by Pieter Partridge, DO on 04/16/2017 at 7:16 AM EDT MRI is normal                       Dr Tomi Likens I called patient to inform MRI was normal but she stated she seen the MRI in My Chart and has questions about why it states she has Small vessel ischemic disease if its norma?  Wants to discuss what this is...Marland KitchenMarland Kitchen

## 2017-04-26 NOTE — Telephone Encounter (Signed)
Those are age-related changes or can be seen in people with history of high blood pressure or cholesterol.  It is very mild and not concerning.

## 2017-04-26 NOTE — Telephone Encounter (Signed)
Discussed with patient Dr Starr Lake recommendations and asked if she had any questions today  She said she is fine and understands now  Told her to contact the office if she has any more questions

## 2017-05-21 ENCOUNTER — Encounter: Payer: Commercial Managed Care - PPO | Admitting: Psychology

## 2017-05-30 ENCOUNTER — Other Ambulatory Visit: Payer: Self-pay | Admitting: Internal Medicine

## 2017-06-11 ENCOUNTER — Ambulatory Visit (INDEPENDENT_AMBULATORY_CARE_PROVIDER_SITE_OTHER): Payer: Commercial Managed Care - PPO | Admitting: Psychology

## 2017-06-11 ENCOUNTER — Encounter: Payer: Self-pay | Admitting: Psychology

## 2017-06-11 DIAGNOSIS — R413 Other amnesia: Secondary | ICD-10-CM | POA: Diagnosis not present

## 2017-06-11 DIAGNOSIS — F419 Anxiety disorder, unspecified: Secondary | ICD-10-CM

## 2017-06-11 NOTE — Progress Notes (Signed)
NEUROPSYCHOLOGICAL INTERVIEW (CPT: [id number])  Name: Cristino Martes Date of Birth: Jul 28, 1956 Date of Interview: 06/11/2017  Reason for Referral:  Cecylia Brazill is a 61 y.o. right handed female who is referred for neuropsychological evaluation by Dr. Metta Clines of [facility] due to concerns about memory loss. This patient is unaccompanied in the office for today's visit.  History of Presenting Problem:  Ms. Bacha was seen by Dr. Tomi Likens on 03/27/2017 for neurologic consultation of vision disturbance and memory loss. The patient reported intermittent visual disturbance for about 2 years, wherein she does not see half of a paragraph or words are missing when she is reading. This occurs once or twice a week, sometimes concurrent with a headache, other times without headache. She also reported memory and word finding difficulties. MMSE was 26/30. MRI of the brain was ordered and completed on 04/15/2017. 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  At today's visit, the patient reports gradual onset of memory/language difficulties about a year ago. She continues to notice these issues and they are very concerning to her. She fears she could be developing Alzheimer's disease. Her mother had a stroke and dementia. She reported that she frequently forgets names of people she knows well. She forgets recent conversations or will think she has told someone something and they tell her she has not. She forgets recent events such as what she ate for dinner last night. She goes to the store to get something and then cannot remember what she went there to purchase. She is frequently misplacing and losing items. She has trouble knowing if she has taken all her medications or not in the morning. She takes each one out of the bottle and puts it in her mouth and then cannot recall which bottles she has taken from and has to spit  out the pills to make sure she has them all. Her husband got her a weekly pill planner but she finds it too confusing to sort her pills out and fill it. She also reports onset of attention difficulties about a year ago, including difficulty concentrating, starting but not finishing tasks, and getting distracted easily. She felt this was interfering with her work (when she was working) so she started taking Adderall. She did notice it helped her focus and get tasks done, but it wore off halfway through the day and she would become very sleepy. She continues to take Adderall on a daily basis. She stopped it for one month in the past to see if it was contributing to her sleep problems but there was no change so she resumed taking it.  The patient noted that she had prior history of memory/cognitive problems when she was on Topamax for migraines about 8 years ago. She would be driving in a familiar place and all of a sudden not know where she was and feel disoriented. She discontinued the Topamax. She continues to have migraines but they are not as frequent as they used to be.  Mrs. Arcadia last worked in January 2018. She was let go from her position and this was extremely stressful and upsetting for her. She continues to demonstrate significant emotional upset surrounding this. She did go on disability two months ago and she took early retirement.   The patient lives with her husband. She manages all complex ADLs but reports difficulty with many of them due to her cognitive symptoms. She reported that she  has gotten lost when driving home from [place] and had to call her husband to help her. (She had gone the wrong direction on the highway for 45 mins). She is more uncertain about routes in general. She started using a calendar in her phone because she had missed at least one appointment. She also started using a notebook to help her be more organized in paying her bills and not forget to pay bills.  She had  surgery in March 2018 for C6-7 herniated disc. She denied any complications with the surgery, and it has helped her neck/arm pain significantly. She does still have pain in her elbow and hands due to osteoarthritis. In 2009 she was found to have aortic dissection, which is just being monitored for right now with regular scans.  The patient has a history of depression and anxiety. She has been treated for many years by psychiatrist Dr. Sheralyn Boatman. She reports that her anxiety level greatly increased since she lost her job in January. She had thought she would work forever. She has tried counseling in the past but never found it helpful. She did not, however, want to be on psychiatric medications forever, so she weaned herself off Effexor. She is still taking Buspar for anxiety, and she takes klonopin as needed for sleep. She reports "horrible" sleep difficulties, waking up frequently and never feeling rested. She does not have sleep apnea. Her appetite is greatly reduced; she does not feel like eating in the last year. She used to exercise regularly (pilates and walking) but she is not really doing this anymore since losing her job. She denies past or present suicidal ideation or intention.     Social History: Born/Raised: Born in [place], [place], moved to [place] at 8 mos old. Raised in [place] and in [place] and [place]. Father was in the [employer]. Occupational history: Now retired/disabled. 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 Briefly at other practices more recently. Let go from last job in Jan 2018.)  Marital history: Married x3 (divorced from first two). With current husband for 14 years.  1 Son, 2 step daughters who she raised, 1 granddaughter who she helped raise Alcohol: Occasional, not regularly. Maybe a beer or glass of wine 2x/month. Tobacco: Former social smoker SA: None   Medical History: Past Medical History:  Diagnosis Date  . Adenomatous  colon polyp   . Aortic dissection ([place])    Dr Nicola Girt, [place]  . Constipation, chronic    Linzess  . Hx of migraines    Topamax prophylaxis  . Hyperlipidemia   . Hypertension   . IBS (irritable bowel syndrome)      Current Medications:  Outpatient Encounter Prescriptions as of 06/11/2017  Medication Sig  . amoxicillin-clavulanate (AUGMENTIN) 875-125 MG tablet Take 1 tablet by mouth 2 (two) times daily.  Marland Kitchen amphetamine-dextroamphetamine (ADDERALL) 20 MG tablet Take 20 mg by mouth daily.  Marland Kitchen aspirin 81 MG tablet Take 81 mg by mouth daily.   Marland Kitchen atorvastatin (LIPITOR) 20 MG tablet TAKE 1 TABLET BY MOUTH EVERY DAY  . busPIRone (BUSPAR) 30 MG tablet Take 30 mg by mouth 2 (two) times daily.  . chlorpheniramine-HYDROcodone (TUSSIONEX PENNKINETIC ER) 10-8 MG/5ML SUER Take 5 mLs by mouth every 12 (twelve) hours as needed for cough.  . clonazePAM (KLONOPIN) 1 MG tablet Take 1 mg by mouth as needed. Reported on 12/05/2015  . cloNIDine (CATAPRES) 0.1 MG tablet TAKE 1 TABLET BY MOUTH TWICE A DAY  .  hydrochlorothiazide (MICROZIDE) 12.5 MG capsule TAKE ONE CAPSULE BY MOUTH EVERY DAY  . labetalol (NORMODYNE) 200 MG tablet TAKE 1 TABLET (200 MG TOTAL) BY MOUTH 3 (THREE) TIMES DAILY.  Marland Kitchen LINZESS 290 MCG CAPS capsule TAKE 1 CAPSULE (290 MCG TOTAL) BY MOUTH DAILY BEFORE BREAKFAST.   No facility-administered encounter medications on file as of 06/11/2017.      Behavioral Observations:   Appearance: Neatly, casually and appropriately dressed and groomed Gait: Ambulated independently, no gross abnormalities observed Speech: Fluent; normal rate, rhythm and volume. Minimal word finding difficulty during conversational speech. Thought process: Linear, goal directed Affect: Full, anxious, tearful, somewhat labile Interpersonal: Very pleasant, engaged, appropriate   TESTING: There is medical necessity to proceed with neuropsychological assessment as the results will be used to aid in differential diagnosis and  clinical decision-making and to inform specific treatment recommendations. Per the patient and medical records reviewed, there has been a change in cognitive functioning and a reasonable suspicion of neurocognitive disorder.   PLAN: The patient will return for a full battery of neuropsychological testing with a psychometrician under my supervision. Education regarding testing procedures was provided. Subsequently, the patient will see this provider for a follow-up session at which time her test performances and my impressions and treatment recommendations will be reviewed in detail.  Full neuropsychological evaluation report to follow.

## 2017-06-13 ENCOUNTER — Telehealth: Payer: Self-pay | Admitting: Internal Medicine

## 2017-06-13 NOTE — Telephone Encounter (Signed)
Prior auth request has been initiated through [email or web address]. We are awaiting insurance response at this time.

## 2017-06-18 ENCOUNTER — Ambulatory Visit (INDEPENDENT_AMBULATORY_CARE_PROVIDER_SITE_OTHER): Payer: Commercial Managed Care - PPO | Admitting: Psychology

## 2017-06-18 DIAGNOSIS — R4789 Other speech disturbances: Secondary | ICD-10-CM

## 2017-06-18 DIAGNOSIS — R413 Other amnesia: Secondary | ICD-10-CM | POA: Diagnosis not present

## 2017-06-18 NOTE — Progress Notes (Signed)
   Neuropsychology Note  [name] returned today for 2 hours of neuropsychological testing with technician, Milana Kidney, BS, under the supervision of Dr. Macarthur Critchley. The patient did not appear overtly distressed by the testing session, per behavioral observation or via self-report to the technician. Rest breaks were offered. [facility] will return within 2 weeks for a feedback session with Dr. Si Raider at which time her test performances, clinical impressions and treatment recommendations will be reviewed in detail. The patient understands she can contact our office should she require our assistance before this time.  Full report to follow.

## 2017-06-19 NOTE — Telephone Encounter (Signed)
I have received a fax from [employer], Maryanna Shape (phone [phone]) that patient's linzess 290 mcg has been approved. Prior authorization number [id number].

## 2017-06-19 NOTE — Telephone Encounter (Signed)
I have spoken to patient to advise that I have sent additional information to insurance as requested. She verbalizes understanding.

## 2017-06-21 NOTE — Progress Notes (Deleted)
NEUROPSYCHOLOGICAL EVALUATION   Name:    Patricia Diaz  Date of Birth:   03-17-1956 Date of Interview:  06/11/2017 Date of Testing:  06/18/2017   Date of Feedback:  06/25/2017       Background Information:  Reason for Referral:  Patricia Diaz is a 61 y.o. right handed female referred by Dr. Metta Clines to assess her current level of cognitive functioning and assist in differential diagnosis. The current evaluation consisted of a review of available medical records, an interview with the patient, and the completion of a neuropsychological testing battery. Informed consent was obtained.  History of Presenting Problem:  Patricia Diaz was seen by Dr. Tomi Likens on 03/27/2017 for neurologic consultation of vision disturbance and memory loss. The patient reported intermittent visual disturbance for about 2 years, wherein she does not see half of a paragraph or words are missing when she is reading. This occurs once or twice a week, sometimes concurrent with a headache, other times without headache. She also reported memory and word finding difficulties. MMSE was 26/30. MRI of the brain was ordered and completed on 04/15/2017. 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  At today's visit, the patient reports gradual onset of memory/language difficulties about a year ago. She continues to notice these issues and they are very concerning to her. She fears she could be developing Alzheimer's disease. Her mother had a stroke and dementia. She reported that she frequently forgets names of people she knows well. She forgets recent conversations or will think she has told someone something and they tell her she has not. She forgets recent events such as what she ate for dinner last night. She goes to the store to get something and then cannot remember what she went there to purchase. She is frequently misplacing and losing  items. She has trouble knowing if she has taken all her medications or not in the morning. She takes each one out of the bottle and puts it in her mouth and then cannot recall which bottles she has taken from and has to spit out the pills to make sure she has them all. Her husband got her a weekly pill planner but she finds it too confusing to sort her pills out and fill it. She also reports onset of attention difficulties about a year ago, including difficulty concentrating, starting but not finishing tasks, and getting distracted easily. She felt this was interfering with her work (when she was working) so she started taking Adderall. She did notice it helped her focus and get tasks done, but it wore off halfway through the day and she would become very sleepy. She continues to take Adderall on a daily basis. She stopped it for one month in the past to see if it was contributing to her sleep problems but there was no change so she resumed taking it.  The patient noted that she had prior history of memory/cognitive problems when she was on Topamax for migraines about 8 years ago. She would be driving in a familiar place and all of a sudden not know where she was and feel disoriented. She discontinued the Topamax. She continues to have migraines but they are not as frequent as they used to be.  Patricia Diaz last worked in January 2018. She was let go from her position and this was extremely stressful and upsetting for her. She continues to demonstrate significant emotional upset  surrounding this. She did go on disability two months ago and she took early retirement.   The patient lives with her husband. She manages all complex ADLs but reports difficulty with many of them due to her cognitive symptoms. She reported that she has gotten lost when driving home from [place] and had to call her husband to help her. (She had gone the wrong direction on the highway for 45 mins). She is more uncertain about routes  in general. She started using a calendar in her phone because she had missed at least one appointment. She also started using a notebook to help her be more organized in paying her bills and not forget to pay bills.  She had surgery in March 2018 for C6-7 herniated disc. She denied any complications with the surgery, and it has helped her neck/arm pain significantly. She does still have pain in her elbow and hands due to osteoarthritis. In 2009 she was found to have aortic dissection, which is just being monitored for right now with regular scans.  The patient has a history of depression and anxiety. She has been treated for many years by psychiatrist Dr. Sheralyn Boatman. She reports that her anxiety level greatly increased since she lost her job in January. She had thought she would work forever. She has tried counseling in the past but never found it helpful. She did not, however, want to be on psychiatric medications forever, so she weaned herself off Effexor. She is still taking Buspar for anxiety, and she takes klonopin as needed for sleep. She reports "horrible" sleep difficulties, waking up frequently and never feeling rested. She does not have sleep apnea. Her appetite is greatly reduced; she does not feel like eating in the last year. She used to exercise regularly (pilates and walking) but she is not really doing this anymore since losing her job. She denies past or present suicidal ideation or intention.     Social History: Born/Raised: Born in [place], [place], moved to [place] at 54 mos old. Raised in [place] and in [place] and [place]. Father was in the [employer]. Occupational history: Now retired/disabled. 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 Briefly at other practices more recently. Let go from last job in Jan 2018.)  Marital history: Married x3 (divorced from first two). With current husband for 14 years.  1 Son, 2 step daughters who she raised, 1  granddaughter who she helped raise Alcohol: Occasional, not regularly. Maybe a beer or glass of wine 2x/month. Tobacco: Former social smoker SA: None   Medical History:  Past Medical History:  Diagnosis Date  . Adenomatous colon polyp   . Aortic dissection ([place])    Dr Nicola Girt, [place]  . Constipation, chronic    Linzess  . Hx of migraines    Topamax prophylaxis  . Hyperlipidemia   . Hypertension   . IBS (irritable bowel syndrome)     Current medications:  Outpatient Encounter Prescriptions as of 06/25/2017  Medication Sig  . amoxicillin-clavulanate (AUGMENTIN) 875-125 MG tablet Take 1 tablet by mouth 2 (two) times daily.  Marland Kitchen amphetamine-dextroamphetamine (ADDERALL) 20 MG tablet Take 20 mg by mouth daily.  Marland Kitchen aspirin 81 MG tablet Take 81 mg by mouth daily.   Marland Kitchen atorvastatin (LIPITOR) 20 MG tablet TAKE 1 TABLET BY MOUTH EVERY DAY  . busPIRone (BUSPAR) 30 MG tablet Take 30 mg by mouth 2 (two) times daily.  . chlorpheniramine-HYDROcodone (TUSSIONEX PENNKINETIC ER) 10-8 MG/5ML SUER Take  5 mLs by mouth every 12 (twelve) hours as needed for cough.  . clonazePAM (KLONOPIN) 1 MG tablet Take 1 mg by mouth as needed. Reported on 12/05/2015  . cloNIDine (CATAPRES) 0.1 MG tablet TAKE 1 TABLET BY MOUTH TWICE A DAY  . hydrochlorothiazide (MICROZIDE) 12.5 MG capsule TAKE ONE CAPSULE BY MOUTH EVERY DAY  . labetalol (NORMODYNE) 200 MG tablet TAKE 1 TABLET (200 MG TOTAL) BY MOUTH 3 (THREE) TIMES DAILY.  Marland Kitchen LINZESS 290 MCG CAPS capsule TAKE 1 CAPSULE (290 MCG TOTAL) BY MOUTH DAILY BEFORE BREAKFAST.   No facility-administered encounter medications on file as of 06/25/2017.      Current Examination:  Behavioral Observations:  Appearance: Neatly, casually and appropriately dressed and groomed Gait: Ambulated independently, no gross abnormalities observed Speech: Fluent; normal rate, rhythm and volume. Minimal word finding difficulty during conversational speech. Thought process: Linear, goal  directed Affect: Full, anxious, tearful, somewhat labile Interpersonal: Very pleasant, engaged, appropriate Orientation: Oriented to all spheres. Accurately named the current President and his predecessor.   Tests Administered: . Test of Premorbid Functioning (TOPF) . Wechsler Adult Intelligence Scale-Fourth Edition (WAIS-IV): Matrix Reasoning, Similarities, Block Design, Arithmetic, Coding and Digit Span and Symbol Search subtests . Wechsler Memory Scale-Fourth Edition (WMS-IV) Adult Version (ages 49-69): Logical Memory I, II and Recognition subtests  . [place] Verbal Learning Test - 2nd Edition (CVLT-2) Standard Form . Repeatable Battery for the Assessment of Neuropsychological Status (RBANS) Form A:  Figure Copy and Recall subtests and Semantic Fluency subtest . Boston Diagnostic Aphasia Examination: Commands subtest . Controlled Oral Word Association Test (COWAT) . Trail Making Test A and B . Clock drawing test . [employer] [facility]) . [place] (BNT) . Beck Anxiety Inventory (BAI) . Generalized Anxiety Disorder - 7 item screener (GAD-7) . Beck Depression Inventory - 2nd Edition (BDI-II)  Test Results: Note: Standardized scores are presented only for use by appropriately trained professionals and to allow for any future test-retest comparison. These scores should not be interpreted without consideration of all the information that is contained in the rest of the report. The most recent standardization samples from the test publisher or other sources were used whenever possible to derive standard scores; scores were corrected for age, gender, ethnicity and education when available.   Test Scores:  ***  Description of Test Results:  Premorbid verbal intellectual abilities were estimated to have been within the average range based on a test of word reading. Psychomotor processing speed was average. Auditory attention and working memory were average.  Visual-spatial construction was average. Language abilities were intact. Specifically, confrontation naming was average, and semantic verbal fluency was average. Auditory comprehension of multi-step commands was intact. With regard to verbal memory, encoding and acquisition of non-contextual information (i.e., word list) was average across five learning trials. After an interference task, free recall was superior (14/16 items recalled). Cued recall was superior (15/16 items recalled). After a delay, free recall was superior (15/16 items recalled). Performance on a yes/no recognition task was high average. On another verbal memory test, encoding and acquisition of contextual auditory information (i.e., short stories) was superior. After a delay, free recall was superior. Performance on a yes/no recognition task was above average. With regard to non-verbal memory, delayed free recall of visual information was average. Executive functioning was intact overall. Mental flexibility and set-shifting were average on Trails B. Verbal fluency with phonemic search restrictions was average. Verbal abstract reasoning was average. Non-verbal abstract reasoning was average. Deductive reasoning was  average to high average. Performance on a clock drawing task was normal.   On a self-report measure of mood, the patient's responses were indicative of at least mild depression at the present time. Symptoms endorsed included: sadness much of the time, pessimism about the future, feeling of past failure, anhedonia, guilty feelings, loss of self confidence, self-criticalness, tearfulness, loss of interest, indecisiveness, feelings of worthlessness, loss of energy, insomnia, reduced appetite, concentration difficulty, fatigue and reduced libido. She denied suicidal ideation or intention. On a self-report measures of anxiety, the patient endorsed clinically significant, severe anxiety characterized by daily nervousness, inability to  control worrying, excessive worrying, difficulty relaxing and fear of something awful happening. She also endorsed a high level of panic symptoms.   Clinical Impressions: No cognitive impairment appreciated on testing. Anxiety disorder, mild depressive episode.     Recommendations/Plan: Based on the findings of the present evaluation, the following recommendations are offered:  1. Counseling (sees psychiatry, should share this report with psychiatrist) 2. Reassurance 3. Education regarding impact of anxiety/depression on cognitive functioning in daily life 4. Baseline for future comparison if needed   Feedback to Patient: Patricia Diaz returned for a feedback appointment on 06/25/2017 to review the results of her neuropsychological evaluation with this provider. *** minutes face-to-face time was spent reviewing her test results, my impressions and my recommendations as detailed above.    Total time spent on this patient's case: [id number] unit for interview with psychologist; [phone] units of testing by psychometrician under psychologist's supervision; [phone] units for medical record review, scoring of neuropsychological tests, interpretation of test results, preparation of this report, and review of results to the patient by psychologist.      Thank you for your referral of [facility]. Please feel free to contact me if you have any questions or concerns regarding this report.

## 2017-06-25 ENCOUNTER — Encounter: Payer: Commercial Managed Care - PPO | Admitting: Psychology

## 2017-08-19 ENCOUNTER — Encounter: Payer: Self-pay | Admitting: Psychology

## 2017-08-19 ENCOUNTER — Ambulatory Visit (INDEPENDENT_AMBULATORY_CARE_PROVIDER_SITE_OTHER): Payer: Commercial Managed Care - PPO | Admitting: Psychology

## 2017-08-19 DIAGNOSIS — R413 Other amnesia: Secondary | ICD-10-CM | POA: Diagnosis not present

## 2017-08-19 DIAGNOSIS — F411 Generalized anxiety disorder: Secondary | ICD-10-CM

## 2017-08-19 NOTE — Patient Instructions (Signed)
Fortunately, results of cognitive testing were entirely within normal limits and NOT indicative of a cognitive disorder or dementia. In fact, on formal memory testing, you performed in the superior range for your age!    It is most likely the case that your memory/cognitive symptoms in daily life are due to anxiety and depression.  Below is some more information on this.  I am hopeful that more aggressive treatment of depression and anxiety (e.g. psychotherapy) will not only improve your mood and coping, but also result in improved cognitive functioning in your daily life.    The effect of depression and anxiety on your cognitive functioning: . One of the typical symptoms of depression is difficulty concentrating and making decisions, and various types of anxiety also interfere with attention and concentration . Problems with attention and concentration can disrupt the process of learning and making new memories, which can make it seem like there is a problem with your memory. In your daily life, you may experience this disruption as forgetting names and appointments, misplacing items, and needing to make lists for shopping and errands. It may be harder for you to stay focused on tasks and feel as "sharp" as you did in the past.  . Also, when we are depressed or anxious, we often pay more attention to our difficulties (rather than our strengths) in our daily life, and this can make it seem to [place] like we are doing worse cognitively than we really are. . The cognitive aspects of depression and anxiety are sometimes observed as an identifiable pattern of poor performance on a neuropsychological evaluation, but it is also possible that all scores on an evaluation are within normal limits. . Regardless of the test scores, distress related to depression and anxiety can interfere with the ability to make use of your cognitive resources and function optimally across settings such as work or school, maintaining  the home and responsibilities, and personal relationships. . Fortunately, there are treatments for depression and anxiety, and when mood improves, cognitive functioning in daily life often improves. . Treatment options include psychotherapy, medications (e.g., antidepressants), and behavioral changes, such as increasing your involvement in enjoyable activities, increasing the amount of exercise you are getting, and maintaining a regular routine.

## 2017-08-19 NOTE — Progress Notes (Signed)
NEUROPSYCHOLOGICAL EVALUATION   Name:    Patricia Diaz  Date of Birth:   04/14/1956 Date of Interview:  06/11/2017 Date of Testing:  06/18/2017   Date of Feedback:  08/19/2017 (patient cancelled several follow up appointments)       Background Information:  Reason for Referral:  Hollee Fate is a 61 y.o. right handed female referred by Dr. Metta Clines to assess her current level of cognitive functioning and assist in differential diagnosis. The current evaluation consisted of a review of available medical records, an interview with the patient, and the completion of a neuropsychological testing battery. Informed consent was obtained.  History of Presenting Problem:  Ms. Ballo was seen by Dr. Tomi Likens on 03/27/2017 for neurologic consultation of vision disturbance and memory loss. The patient reported intermittent visual disturbance for about 2 years, wherein she does not see half of a paragraph or words are missing when she is reading. This occurs once or twice a week, sometimes concurrent with a headache, other times without headache. She also reported memory and word finding difficulties. MMSE was 26/30. MRI of the brain was ordered and completed on 04/15/2017. 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   At today's visit, the patient reports gradual onset of memory/language difficulties about a year ago. She continues to notice these issues and they are very concerning to her. She fears she could be developing Alzheimer's disease. Her mother had a stroke and dementia. She reported that she frequently forgets names of people she knows well. She forgets recent conversations or will think she has told someone something and they tell her she has not. She forgets recent events such as what she ate for dinner last night. She goes to the store to get something and then cannot remember what she went there to  purchase. She is frequently misplacing and losing items. She has trouble knowing if she has taken all her medications or not in the morning. She takes each one out of the bottle and puts it in her mouth and then cannot recall which bottles she has taken from and has to spit out the pills to make sure she has them all. Her husband got her a weekly pill planner but she finds it too confusing to sort her pills out and fill it. She also reports onset of attention difficulties about a year ago, including difficulty concentrating, starting but not finishing tasks, and getting distracted easily. She felt this was interfering with her work (when she was working) so she started taking Adderall. She did notice it helped her focus and get tasks done, but it wore off halfway through the day and she would become very sleepy. She continues to take Adderall on a daily basis. She stopped it for one month in the past to see if it was contributing to her sleep problems but there was no change so she resumed taking it.   The patient noted that she had prior history of memory/cognitive problems when she was on Topamax for migraines about 8 years ago. She would be driving in a familiar place and all of a sudden not know where she was and feel disoriented. She discontinued the Topamax. She continues to have migraines but they are not as frequent as they used to be.   Mrs. Warwick last worked in January 2018. She was let go from her position and this was extremely stressful and upsetting  for her. She continues to demonstrate significant emotional upset surrounding this. She did go on disability two months ago and she took early retirement.    The patient lives with her husband. She manages all complex ADLs but reports difficulty with many of them due to her cognitive symptoms. She reported that she has gotten lost when driving home from [place] and had to call her husband to help her. (She had gone the wrong direction on the highway  for 45 mins). She is more uncertain about routes in general. She started using a calendar in her phone because she had missed at least one appointment. She also started using a notebook to help her be more organized in paying her bills and not forget to pay bills.   She had surgery in March 2018 for C6-7 herniated disc. She denied any complications with the surgery, and it has helped her neck/arm pain significantly. She does still have pain in her elbow and hands due to osteoarthritis. In 2009 she was found to have aortic dissection, which is just being monitored for right now with regular scans.   The patient has a history of depression and anxiety. She has been treated for many years by psychiatrist Dr. Sheralyn Boatman. She reports that her anxiety level greatly increased since she lost her job in January. She had thought she would work forever. She has tried counseling in the past but never found it helpful. She did not, however, want to be on psychiatric medications forever, so she weaned herself off Effexor. She is still taking Buspar for anxiety, and she takes klonopin as needed for sleep. She reports "horrible" sleep difficulties, waking up frequently and never feeling rested. She does not have sleep apnea. Her appetite is greatly reduced; she does not feel like eating in the last year. She used to exercise regularly (pilates and walking) but she is not really doing this anymore since losing her job. She denies past or present suicidal ideation or intention.       Social History: Born/Raised: Born in [place], [place], moved to [place] at 20 mos old. Raised in [place] and in [place] and [place]. Father was in the [employer]. Occupational history: Now retired/disabled. 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 Briefly at other practices more recently. Let go from last job in Jan 2018.)  Marital history: Married x3 (divorced from first two). With current husband for 14  years.  1 Son, 2 step daughters who she raised, 1 granddaughter who she helped raise Alcohol: Occasional, not regularly. Maybe a beer or glass of wine 2x/month. Tobacco: Former social smoker SA: None  Medical History:  Past Medical History:  Diagnosis Date  . Adenomatous colon polyp   . Aortic dissection ([place])    Dr Nicola Girt, [place]  . Constipation, chronic    Linzess  . Hx of migraines    Topamax prophylaxis  . Hyperlipidemia   . Hypertension   . IBS (irritable bowel syndrome)     Current medications:  Outpatient Encounter Prescriptions as of 08/19/2017  Medication Sig  . amoxicillin-clavulanate (AUGMENTIN) 875-125 MG tablet Take 1 tablet by mouth 2 (two) times daily.  Marland Kitchen amphetamine-dextroamphetamine (ADDERALL) 20 MG tablet Take 20 mg by mouth daily.  Marland Kitchen aspirin 81 MG tablet Take 81 mg by mouth daily.   Marland Kitchen atorvastatin (LIPITOR) 20 MG tablet TAKE 1 TABLET BY MOUTH EVERY DAY  . busPIRone (BUSPAR) 30 MG tablet Take 30 mg by mouth 2 (  two) times daily.  . chlorpheniramine-HYDROcodone (TUSSIONEX PENNKINETIC ER) 10-8 MG/5ML SUER Take 5 mLs by mouth every 12 (twelve) hours as needed for cough.  . clonazePAM (KLONOPIN) 1 MG tablet Take 1 mg by mouth as needed. Reported on 12/05/2015  . cloNIDine (CATAPRES) 0.1 MG tablet TAKE 1 TABLET BY MOUTH TWICE A DAY  . hydrochlorothiazide (MICROZIDE) 12.5 MG capsule TAKE ONE CAPSULE BY MOUTH EVERY DAY  . labetalol (NORMODYNE) 200 MG tablet TAKE 1 TABLET (200 MG TOTAL) BY MOUTH 3 (THREE) TIMES DAILY.  Marland Kitchen LINZESS 290 MCG CAPS capsule TAKE 1 CAPSULE (290 MCG TOTAL) BY MOUTH DAILY BEFORE BREAKFAST.   No facility-administered encounter medications on file as of 08/19/2017.      Current Examination:  Behavioral Observations:  Appearance: Neatly, casually and appropriately dressed and groomed Gait: Ambulated independently, no gross abnormalities observed Speech: Fluent; normal rate, rhythm and volume. Minimal word finding difficulty during  conversational speech. Thought process: Linear, goal directed Affect: Full, anxious, tearful, somewhat labile Interpersonal: Very pleasant, engaged, appropriate Orientation: Oriented to all spheres. Accurately named the current President and his predecessor.   Tests Administered: . Test of Premorbid Functioning (TOPF) . Wechsler Adult Intelligence Scale-Fourth Edition (WAIS-IV): Matrix Reasoning, Similarities, Block Design, Arithmetic, Coding and Digit Span and Symbol Search subtests . Wechsler Memory Scale-Fourth Edition (WMS-IV) Adult Version (ages 70-69): Logical Memory I, II and Recognition subtests  . [place] Verbal Learning Test - 2nd Edition (CVLT-2) Standard Form . Repeatable Battery for the Assessment of Neuropsychological Status (RBANS) Form A:  Figure Copy and Recall subtests and Semantic Fluency subtest . Boston Diagnostic Aphasia Examination: Commands subtest . Controlled Oral Word Association Test (COWAT) . Trail Making Test A and B . Clock drawing test . [employer] [facility]) . [place] (BNT) . Beck Anxiety Inventory (BAI) . Generalized Anxiety Disorder - 7 item screener (GAD-7) . Beck Depression Inventory - 2nd Edition (BDI-II)  Test Results: Note: Standardized scores are presented only for use by appropriately trained professionals and to allow for any future test-retest comparison. These scores should not be interpreted without consideration of all the information that is contained in the rest of the report. The most recent standardization samples from the test publisher or other sources were used whenever possible to derive standard scores; scores were corrected for age, gender, ethnicity and education when available.   Test Scores:  Test Name Raw Score Standardized Score Descriptor  TOPF 42/70 SS= 100 Average  WAIS-IV Subtests     Matrix Reasoning 14/26 ss= 9 Average  Similarities 25/36 ss= 10 Average  Block Design 33/66 ss= 9 Average    Arithmetic 12/22 ss= 8 Average  Coding 65/135 ss= 11 Average  Digit Span  25/48 ss= 9 Average  Symbol Search 30/60 ss= 11 Average  WAIS Index Scores     Working Memory  SS= 92 Average  Processing Speed  SS= 105 Average  WMS-IV Subtests     LM I 39/50 ss= 15 Superior  LM II 35/50 ss= 15 Superior  LM II Recognition 28/30 Cum %: >75 Above average  RBANS Subtests     Figure Copy 18/20 Z= -0.1 Average  Figure Recall 11/20 Z= -0.7 Average  Semantic Fluency 18/40 Z= -0.7 Average  CVLT-II Scores     Trial 1 4/16 Z= -1 Low average  Trial 5 13/16 Z= 0.5 Average  Trials 1-5 total 49/80 T= 53 Average  SD Free Recall 14/16 Z= 1.5 Superior  SD Cued Recall 15/16 Z= 1.5 Superior  LD Free Recall 15/16 Z= 1.5 Superior  LD Cued Recall 14/16 Z= 1 High average  Recognition Discriminability 16/16 hits,1 false positive Z= 1 High average  Forced Choice Recognition 16/16  WNL  BDAE Subtest     Commands 15/15  WNL  COWAT-FAS 33 T= 43 Average  COWAT-Animals 17 T= 47 Average  Trail Making Test A  26" 0 errors T= 58 High average  Trail Making Test B  81" 1 error T= 50 Average  Clock Drawing   WNL  WCST     Total Errors 14 T= 53 Average  Perseverative Responses 6 T= 57 High average  Perseverative Errors 5 T= 61 High average  Conceptual Level Responses 42 T= 49 Average  Categories Completed 4 >16% WNL  Trials to Complete 1st Category 14 >16% WNL  Failure to Maintain Set 0  WNL  BNT 55/60 T= 48 Average  BAI 40/63  Severe  GAD-7 18/21  Severe  BDI-II 19/63  Mild      Description of Test Results:  Premorbid verbal intellectual abilities were estimated to have been within the average range based on a test of word reading. Psychomotor processing speed was average. Auditory attention and working memory were average. Visual-spatial construction was average. Language abilities were intact. Specifically, confrontation naming was average, and semantic verbal fluency was average. Auditory comprehension  of multi-step commands was intact. With regard to verbal memory, encoding and acquisition of non-contextual information (i.e., word list) was average across five learning trials. After an interference task, free recall was superior (14/16 items recalled). Cued recall was superior (15/16 items recalled). After a delay, free recall was superior (15/16 items recalled). Performance on a yes/no recognition task was high average. On another verbal memory test, encoding and acquisition of contextual auditory information (i.e., short stories) was superior. After a delay, free recall was superior. Performance on a yes/no recognition task was above average. With regard to non-verbal memory, delayed free recall of visual information was average. Executive functioning was intact overall. Mental flexibility and set-shifting were average on Trails B. Verbal fluency with phonemic search restrictions was average. Verbal abstract reasoning was average. Non-verbal abstract reasoning was average. Deductive reasoning was average to high average. Performance on a clock drawing task was normal.   On a self-report measure of mood, the patient's responses were indicative of at least mild depression at the present time. Symptoms endorsed included: sadness much of the time, pessimism about the future, feeling of past failure, anhedonia, guilty feelings, loss of self confidence, self-criticalness, tearfulness, loss of interest, indecisiveness, feelings of worthlessness, loss of energy, insomnia, reduced appetite, concentration difficulty, fatigue and reduced libido. She denied suicidal ideation or intention. On a self-report measures of anxiety, the patient endorsed clinically significant, severe anxiety characterized by daily nervousness, inability to control worrying, excessive worrying, difficulty relaxing and fear of something awful happening. She also endorsed a high level of panic symptoms.   Clinical Impressions: No cognitive  impairment appreciated on testing. Generalized anxiety disorder, mild depressive episode. Results of cognitive testing were entirely within normal limits. There were no impaired performances, and all performances ranged from average to superior. In fact, her recall abilities on two different robust measures of auditory memory was superior for age. I do not see any evidence to suggest any cognitive disorder or underlying dementia. However, she does present with significant anxiety and at least mild depression. I suspect her self-reported cognitive symptoms in daily life are due to her anxiety and depression.  Recommendations/Plan: Based on the findings of the present evaluation, the following recommendations are offered:  1. She currently sees a psychiatrist but the addition of individual counseling utilizing CBT or mindfulness based CBT is highly recommended. She was amenable to this recommendation and is motivated to start counseling. She accepted a referral to [facility] Trey Paula, Royetta Crochet or Pervis Hocking).   2. The patient was reassured that her test results were normal, and in many cases well above average for age, with no evidence of an underlying cognitive disorder or dementia. These results will serve as a nice baseline for future comparison if ever needed. 3. Education regarding the impact of anxiety/depression on cognitive functioning in daily life was reviewed.    Feedback to Patient: Adely Facer returned for a feedback appointment on 08/19/2017 to review the results of her neuropsychological evaluation with this provider. 15 minutes face-to-face time was spent reviewing her test results, my impressions and my recommendations as detailed above.    Total time spent on this patient's case: [id number] unit for interview with psychologist; [phone] units of testing by psychometrician under psychologist's supervision; [phone] units for medical record review,  scoring of neuropsychological tests, interpretation of test results, preparation of this report, and review of results to the patient by psychologist.      Thank you for your referral of [facility]. Please feel free to contact me if you have any questions or concerns regarding this report.

## 2017-08-31 ENCOUNTER — Other Ambulatory Visit: Payer: Self-pay | Admitting: Internal Medicine

## 2017-08-31 DIAGNOSIS — I10 Essential (primary) hypertension: Secondary | ICD-10-CM

## 2017-09-04 ENCOUNTER — Telehealth: Payer: Self-pay | Admitting: Internal Medicine

## 2017-09-04 MED ORDER — LINACLOTIDE 290 MCG PO CAPS: 290.0000 ug | ORAL_CAPSULE | Freq: Every day | ORAL | 1 refills | Status: DC

## 2017-09-04 NOTE — Telephone Encounter (Signed)
Follow up scheduled for 10-15-2017. Refills given on Linzess 290 mcg one a day. #30 1 refill. She must keep follow up for further refills.

## 2017-09-06 NOTE — Telephone Encounter (Signed)
I advised patient that pharmacy did get the rx for Linzess because they have sent [place] a prior authorization request. Prior authorization has been requested from insurance as of yesterday and we are awaiting a response. Patient is completely out of medication so I am providing her with 8 capsules of Linzess 290 mcg to get her though the wait period for prior authorization. She verbalizes understanding.

## 2017-09-06 NOTE — Telephone Encounter (Signed)
Patient states that pharmacy did not received rx. Please resend to [facility] on [address] in [place].

## 2017-09-24 ENCOUNTER — Ambulatory Visit: Payer: Commercial Managed Care - PPO | Admitting: Psychology

## 2017-09-25 ENCOUNTER — Other Ambulatory Visit: Payer: Self-pay | Admitting: Internal Medicine

## 2017-09-25 DIAGNOSIS — I10 Essential (primary) hypertension: Secondary | ICD-10-CM

## 2017-09-30 ENCOUNTER — Other Ambulatory Visit: Payer: Self-pay | Admitting: Internal Medicine

## 2017-10-15 ENCOUNTER — Encounter: Payer: Self-pay | Admitting: Internal Medicine

## 2017-10-15 ENCOUNTER — Ambulatory Visit: Payer: Commercial Managed Care - PPO | Admitting: Internal Medicine

## 2017-10-15 VITALS — BP 110/76 | HR 72 | Ht 66.0 in | Wt 141.4 lb

## 2017-10-15 DIAGNOSIS — K581 Irritable bowel syndrome with constipation: Secondary | ICD-10-CM

## 2017-10-15 MED ORDER — LINACLOTIDE 290 MCG PO CAPS: 290.0000 ug | ORAL_CAPSULE | Freq: Every day | ORAL | 3 refills | Status: DC

## 2017-10-15 NOTE — Progress Notes (Signed)
   Subjective:    Patient ID: Patricia Diaz, female    DOB: 1955/12/16, 61 y.o.   MRN: [id number]  HPI Patricia Diaz is a 61 year old female with a history of IBS with constipation, remote small adenomatous colon polyp, aortic dissection and hypertension who is here for follow-up.  She was last seen on 08/14/2016 and she presents alone today.  She has been doing very well with Linzess 290 mcg daily.  We changed her dose last year.  She has had excellent control of her constipation and improvement in her abdominal bloating.  No abdominal pain.  She is having a bowel movement 5-7 days/week.  No diarrhea.  No blood in her stool or melena.  No upper GI complaint.  Last colonoscopy 04/22/2013 with good prep.  Left-sided diverticulosis.  No polyps.  10-year recall recommended.   Review of Systems As per HPI, otherwise negative.  Current Medications, Allergies, Past Medical History, Past Surgical History, Family History and Social History were reviewed in [employer] record.     Objective:   Physical Exam BP 110/76   Pulse 72   Ht 5\' 6"  (1.676 m)   Wt 141 lb 6 oz (64.1 kg)   BMI 22.82 kg/m  Constitutional: Well-developed and well-nourished. No distress. HEENT: Normocephalic and atraumatic.   No scleral icterus. Neck: Neck supple. Trachea midline. Cardiovascular: Normal rate, regular rhythm and intact distal pulses. No M/R/G Pulmonary/chest: Effort normal and breath sounds normal. No wheezing, rales or rhonchi. Abdominal: Soft, nontender, nondistended. Bowel sounds active throughout. There are no masses palpable. No hepatosplenomegaly. Extremities: no clubbing, cyanosis, or edema Neurological: Alert and oriented to person place and time. Skin: Skin is warm and dry. Psychiatric: Normal mood and affect. Behavior is normal.      Assessment & Plan:  61 year old female with a history of IBS with constipation, remote small adenomatous colon polyp, aortic dissection  and hypertension who is here for follow-up.  1. IBS-C --continue Linzess 290 mcg daily.  Excellent control of symptoms.  2.  CRC screening --repeat colonoscopy recommended in 2024.  Annual follow-up, sooner if needed 15 minutes spent with the patient today. Greater than 50% was spent in counseling and coordination of care with the patient

## 2017-10-15 NOTE — Patient Instructions (Signed)
We have sent the following medications to your pharmacy for you to pick up at your convenience: Linzess 290 mcg daily  Please follow up with Dr Hilarie Fredrickson in 1 year.  If you are age 63 or older, your body mass index should be between 23-30. Your Body mass index is 22.82 kg/m. If this is out of the aforementioned range listed, please consider follow up with your Primary Care Provider.  If you are age 25 or younger, your body mass index should be between 19-25. Your Body mass index is 22.82 kg/m. If this is out of the aformentioned range listed, please consider follow up with your Primary Care Provider.

## 2017-10-18 ENCOUNTER — Ambulatory Visit (INDEPENDENT_AMBULATORY_CARE_PROVIDER_SITE_OTHER): Payer: Commercial Managed Care - PPO | Admitting: Psychology

## 2017-10-18 DIAGNOSIS — F4323 Adjustment disorder with mixed anxiety and depressed mood: Secondary | ICD-10-CM

## 2017-11-09 ENCOUNTER — Other Ambulatory Visit: Payer: Self-pay | Admitting: Internal Medicine

## 2017-11-09 DIAGNOSIS — E785 Hyperlipidemia, unspecified: Secondary | ICD-10-CM

## 2017-11-29 ENCOUNTER — Ambulatory Visit: Payer: Commercial Managed Care - PPO | Admitting: Psychology

## 2017-11-29 ENCOUNTER — Other Ambulatory Visit: Payer: Self-pay | Admitting: Internal Medicine

## 2017-11-29 DIAGNOSIS — I10 Essential (primary) hypertension: Secondary | ICD-10-CM

## 2017-12-16 ENCOUNTER — Encounter: Payer: Self-pay | Admitting: Internal Medicine

## 2017-12-16 ENCOUNTER — Ambulatory Visit (INDEPENDENT_AMBULATORY_CARE_PROVIDER_SITE_OTHER): Payer: Commercial Managed Care - PPO | Admitting: Internal Medicine

## 2017-12-16 ENCOUNTER — Other Ambulatory Visit (INDEPENDENT_AMBULATORY_CARE_PROVIDER_SITE_OTHER): Payer: Commercial Managed Care - PPO

## 2017-12-16 VITALS — BP 100/60 | HR 59 | Temp 97.7°F | Resp 16 | Ht 66.0 in | Wt 136.0 lb

## 2017-12-16 DIAGNOSIS — K5900 Constipation, unspecified: Secondary | ICD-10-CM

## 2017-12-16 DIAGNOSIS — E559 Vitamin D deficiency, unspecified: Secondary | ICD-10-CM | POA: Diagnosis not present

## 2017-12-16 DIAGNOSIS — Z0000 Encounter for general adult medical examination without abnormal findings: Secondary | ICD-10-CM

## 2017-12-16 DIAGNOSIS — Z23 Encounter for immunization: Secondary | ICD-10-CM | POA: Diagnosis not present

## 2017-12-16 DIAGNOSIS — E785 Hyperlipidemia, unspecified: Secondary | ICD-10-CM | POA: Diagnosis not present

## 2017-12-16 DIAGNOSIS — I7101 Dissection of thoracic aorta: Secondary | ICD-10-CM

## 2017-12-16 DIAGNOSIS — I10 Essential (primary) hypertension: Secondary | ICD-10-CM | POA: Diagnosis not present

## 2017-12-16 DIAGNOSIS — I71019 Dissection of thoracic aorta, unspecified: Secondary | ICD-10-CM

## 2017-12-16 LAB — COMPREHENSIVE METABOLIC PANEL
ALBUMIN: 4.4 g/dL (ref 3.5–5.2)
ALK PHOS: 54 U/L (ref 39–117)
ALT: 23 U/L (ref 0–35)
AST: 18 U/L (ref 0–37)
BILIRUBIN TOTAL: 1.2 mg/dL (ref 0.2–1.2)
BUN: 15 mg/dL (ref 6–23)
CO2: 29 mEq/L (ref 19–32)
Calcium: 9.9 mg/dL (ref 8.4–10.5)
Chloride: 102 mEq/L (ref 96–112)
Creatinine, Ser: 0.64 mg/dL (ref 0.40–1.20)
GFR: 99.32 mL/min (ref >60.00)
GLUCOSE: 113 mg/dL — AB (ref 70–99)
Potassium: 4.1 mEq/L (ref 3.5–5.1)
SODIUM: 138 meq/L (ref 135–145)
TOTAL PROTEIN: 7 g/dL (ref 6.0–8.3)

## 2017-12-16 LAB — CBC
HCT: 43.1 % (ref 36.0–46.0)
HEMOGLOBIN: 14.3 g/dL (ref 12.0–15.0)
MCHC: 33.3 g/dL (ref 30.0–36.0)
MCV: 93.2 fl (ref 78.0–100.0)
Platelets: 202 10*3/uL (ref 150.0–400.0)
RBC: 4.62 Mil/uL (ref 3.87–5.11)
RDW: 12.5 % (ref 11.5–15.5)
WBC: 5.2 10*3/uL (ref 4.0–10.5)

## 2017-12-16 LAB — LIPID PANEL
Cholesterol: 133 mg/dL (ref 0–200)
HDL: 52.4 mg/dL (ref >39.00)
LDL Cholesterol: 60 mg/dL (ref 0–99)
NonHDL: 80.33
Total CHOL/HDL Ratio: 3
Triglycerides: 104 mg/dL (ref 0.0–149.0)
VLDL: 20.8 mg/dL (ref 0.0–40.0)

## 2017-12-16 LAB — TSH: TSH: 1.4 u[IU]/mL (ref 0.35–4.50)

## 2017-12-16 LAB — VITAMIN D 25 HYDROXY (VIT D DEFICIENCY, FRACTURES): VITD: 35.06 ng/mL (ref 30.00–100.00)

## 2017-12-16 LAB — T4, FREE: FREE T4: 0.75 ng/dL (ref 0.60–1.60)

## 2017-12-16 MED ORDER — CLONIDINE HCL 0.1 MG PO TABS: 0.1000 mg | ORAL_TABLET | Freq: Two times a day (BID) | ORAL | 3 refills | Status: AC

## 2017-12-16 MED ORDER — ATORVASTATIN CALCIUM 20 MG PO TABS: 20.0000 mg | ORAL_TABLET | Freq: Every day | ORAL | 3 refills | Status: AC

## 2017-12-16 MED ORDER — HYDROCHLOROTHIAZIDE 12.5 MG PO CAPS: 12.5000 mg | ORAL_CAPSULE | Freq: Every day | ORAL | 3 refills | Status: AC

## 2017-12-16 MED ORDER — LABETALOL HCL 200 MG PO TABS: 200.0000 mg | ORAL_TABLET | Freq: Three times a day (TID) | ORAL | 3 refills | Status: AC

## 2017-12-16 MED ORDER — LABETALOL HCL 200 MG PO TABS: 200.0000 mg | ORAL_TABLET | Freq: Three times a day (TID) | ORAL | 0 refills | Status: DC

## 2017-12-16 MED ORDER — ATORVASTATIN CALCIUM 20 MG PO TABS: 20.0000 mg | ORAL_TABLET | Freq: Every day | ORAL | 0 refills | Status: DC

## 2017-12-16 MED ORDER — CLONIDINE HCL 0.1 MG PO TABS: 0.1000 mg | ORAL_TABLET | Freq: Two times a day (BID) | ORAL | 0 refills | Status: DC

## 2017-12-16 MED ORDER — HYDROCHLOROTHIAZIDE 12.5 MG PO CAPS: 12.5000 mg | ORAL_CAPSULE | Freq: Every day | ORAL | 0 refills | Status: DC

## 2017-12-16 NOTE — Assessment & Plan Note (Signed)
Doing well with linzess and 3-5 BM per week. No blood, colonoscopy up to date.

## 2017-12-16 NOTE — Assessment & Plan Note (Signed)
Gets imaging this summer for evaluation and continues to follow with this.

## 2017-12-16 NOTE — Assessment & Plan Note (Signed)
Ordered mammogram, flu shot given at visit. Declines hiv and hep c screening. Tetanus up to date. Colonoscopy up to date. Gets exam with gyn yearly. Counseled about mental health maintenance and getting some part time work or volunteer time to help her cope with not working. Given screening recommendations.

## 2017-12-16 NOTE — Progress Notes (Signed)
   Subjective:    Patient ID: Patricia Diaz, female    DOB: [birth year], 62 y.o.   MRN: [id number]  HPI The patient is a 62 YO female coming in for physical. Having problems adjusting to not working and is more depressed and anxious.   PMH, [facility], social history reviewed and updated.   Review of Systems  Constitutional: Negative.   HENT: Negative.   Eyes: Negative.   Respiratory: Negative for cough, chest tightness and shortness of breath.   Cardiovascular: Negative for chest pain, palpitations and leg swelling.  Gastrointestinal: Negative for abdominal distention, abdominal pain, constipation, diarrhea, nausea and vomiting.  Musculoskeletal: Negative.   Skin: Negative.   Neurological: Negative.   Psychiatric/Behavioral: Positive for decreased concentration. The patient is nervous/anxious.       Objective:   Physical Exam  Constitutional: She is oriented to person, place, and time. She appears well-developed and well-nourished.  HENT:  Head: Normocephalic and atraumatic.  Eyes: EOM are normal.  Neck: Normal range of motion.  Cardiovascular: Normal rate and regular rhythm.  Pulmonary/Chest: Effort normal and breath sounds normal. No respiratory distress. She has no wheezes. She has no rales.  Abdominal: Soft. Bowel sounds are normal. She exhibits no distension. There is no tenderness. There is no rebound.  Musculoskeletal: She exhibits no edema.  Neurological: She is alert and oriented to person, place, and time. Coordination normal.  Skin: Skin is warm and dry.  Psychiatric: She has a normal mood and affect.   Vitals:   12/16/17 1449  BP: 100/60  Pulse: (!) 59  Resp: 16  Temp: 97.7 F (36.5 C)  TempSrc: Oral  SpO2: 96%  Weight: 136 lb (61.7 kg)  Height: 5\' 6"  (1.676 m)      Assessment & Plan:  Flu shot given at visit

## 2017-12-16 NOTE — Patient Instructions (Signed)

## 2017-12-16 NOTE — Assessment & Plan Note (Signed)
BP at goal on her labetalol, clonidine, hctz. Checking CMP and adjust as needed.

## 2017-12-16 NOTE — Assessment & Plan Note (Signed)
Checking vitamin D level and adjust as needed.  

## 2017-12-16 NOTE — Assessment & Plan Note (Signed)
Checking lipid panel, taking lipitor 20 mg daily. Adjust as needed for goal <100.

## 2018-05-14 ENCOUNTER — Ambulatory Visit: Payer: Self-pay | Admitting: Internal Medicine

## 2018-05-14 NOTE — Telephone Encounter (Signed)
She called in c/o being dizzy, very lightheaded to the point she has to hold onto things, losing weight 20 lbs since March or April.    She fell this past Sunday after blacking out and hit her head.   She did not go to the ED because "It's such a long wait".   See triage notes.  The agent made her an appt with Dr. Sharlet Salina for 05/15/18 at 2:00 but I have referred her to the ED.   Her husband is going to take her to [facility] ED since that is where she goes for her care.   He will be home in an hour per pt and she will have him take her then.  I routed a note to Dr. Sharlet Salina making her aware.   Reason for Disposition . SEVERE dizziness (e.g., unable to stand, requires support to walk, feels like passing out now)  Answer Assessment - Initial Assessment Questions 1. DESCRIPTION: "Describe your dizziness."     I felt unsteady all day on Sunday.  I was watching a movie and got up to go to the kitchen I started feeling dizzy.   I grabbed for the banister and I fell back.   My husband put me to bed and put ice on it.   I'm still having some dizziness.   On Monday I got up to change the thermostat I fell onto the floor.   I did not hit my head.   I'm losing a lot of weight about 20 lbs since March or April.   I'm eating my normal food.  Denies diarrhea or vomiting.     I had a concussion back April from a fall. 2. LIGHTHEADED: "Do you feel lightheaded?" (e.g., somewhat faint, woozy, weak upon standing)     I get lightheaded and dizzy when I get up.    On Sunday when I fell I hit my head.   The top back of my head.   I blacked out for a second.    I did not go to the ED after this fall but I went after the fall in April.   3. VERTIGO: "Do you feel like either you or the room is spinning or tilting?" (i.e. vertigo)     No spinning. 4. SEVERITY: "How bad is it?"  "Do you feel like you are going to faint?" "Can you stand and walk?"   - MILD - walking normally   - MODERATE - interferes with normal  activities (e.g., work, school)    - SEVERE - unable to stand, requires support to walk, feels like passing out now.      I'm still weak and dizzy today. 5. ONSET:  "When did the dizziness begin?"    The dizziness has been ongoing since the concussion in April.   6. AGGRAVATING FACTORS: "Does anything make it worse?" (e.g., standing, change in head position)     Getting up from a sitting position.   They did a CT scan in the ED and said I had a severe concussion.   The dizziness hits me all of a sudden. 7. HEART RATE: "Can you tell me your heart rate?" "How many beats in 15 seconds?"  (Note: not all patients can do this)       *No Answer* 8. CAUSE: "What do you think is causing the dizziness?"     The concussion from April.   I was told it would take a long time to get over the  concussion. 9. RECURRENT SYMPTOM: "Have you had dizziness before?" If so, ask: "When was the last time?" "What happened that time?"     I've been dizzy since my concussion.     I'm concerned about the 20 lb weight loss.   The lightheadedness really concerns. 10. OTHER SYMPTOMS: "Do you have any other symptoms?" (e.g., fever, chest pain, vomiting, diarrhea, bleeding)       No other symptoms.   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   They do yearly CTs. 11. PREGNANCY: "Is there any chance you are pregnant?" "When was your last menstrual period?"       Not asked due to age  Protocols used: DIZZINESS [facility]

## 2018-05-14 NOTE — Telephone Encounter (Signed)
Noted  

## 2018-05-15 ENCOUNTER — Ambulatory Visit: Payer: Commercial Managed Care - PPO | Admitting: Internal Medicine

## 2018-05-15 DIAGNOSIS — Z0289 Encounter for other administrative examinations: Secondary | ICD-10-CM

## 2018-06-25 ENCOUNTER — Telehealth: Payer: Self-pay | Admitting: Internal Medicine

## 2018-06-25 MED ORDER — LINACLOTIDE 145 MCG PO CAPS: 145.0000 ug | ORAL_CAPSULE | Freq: Every day | ORAL | 1 refills | Status: AC

## 2018-06-25 NOTE — Telephone Encounter (Signed)
Yes okay to reduce dose to 145 mcg daily

## 2018-06-25 NOTE — Telephone Encounter (Signed)
Rx for Linzess 145 mcg sent to pharmacy in place of current Linzess 290 mcg dosage.

## 2018-06-25 NOTE — Telephone Encounter (Signed)
Ok to change to 145 mcg dosing? She will be due for office visit 09/2018.Marland KitchenMarland Kitchen

## 2022-09-14 ENCOUNTER — Other Ambulatory Visit (HOSPITAL_BASED_OUTPATIENT_CLINIC_OR_DEPARTMENT_OTHER): Payer: Self-pay

## 2023-05-03 ENCOUNTER — Encounter: Payer: Self-pay | Admitting: Internal Medicine
